# Patient Record
Sex: Female | Born: 1940 | Race: White | Hispanic: No | Marital: Married | Smoking: Never smoker
Health system: Southern US, Community
[De-identification: ages and names within clinical notes are randomized; demographics above are authoritative.]

## PROBLEM LIST (undated history)

## (undated) DIAGNOSIS — G309 Alzheimer's disease, unspecified: Principal | ICD-10-CM

## (undated) DIAGNOSIS — I480 Paroxysmal atrial fibrillation: Secondary | ICD-10-CM

## (undated) DIAGNOSIS — S0990XA Unspecified injury of head, initial encounter: Secondary | ICD-10-CM

## (undated) DIAGNOSIS — M1990 Unspecified osteoarthritis, unspecified site: Secondary | ICD-10-CM

## (undated) DIAGNOSIS — R413 Other amnesia: Secondary | ICD-10-CM

## (undated) DIAGNOSIS — F329 Major depressive disorder, single episode, unspecified: Secondary | ICD-10-CM

## (undated) DIAGNOSIS — R42 Dizziness and giddiness: Secondary | ICD-10-CM

## (undated) DIAGNOSIS — E785 Hyperlipidemia, unspecified: Secondary | ICD-10-CM

## (undated) DIAGNOSIS — E669 Obesity, unspecified: Secondary | ICD-10-CM

## (undated) DIAGNOSIS — F0280 Dementia in other diseases classified elsewhere without behavioral disturbance: Secondary | ICD-10-CM

## (undated) DIAGNOSIS — I10 Essential (primary) hypertension: Secondary | ICD-10-CM

## (undated) DIAGNOSIS — N390 Urinary tract infection, site not specified: Secondary | ICD-10-CM

## (undated) DIAGNOSIS — F32A Depression, unspecified: Secondary | ICD-10-CM

## (undated) HISTORY — DX: Other amnesia: R41.3

## (undated) HISTORY — PX: ABDOMINAL HYSTERECTOMY: SHX81

## (undated) HISTORY — PX: HUMERUS FRACTURE SURGERY: SHX670

## (undated) HISTORY — DX: Dizziness and giddiness: R42

## (undated) HISTORY — DX: Essential (primary) hypertension: I10

## (undated) HISTORY — DX: Dementia in other diseases classified elsewhere without behavioral disturbance: F02.80

## (undated) HISTORY — DX: Unspecified osteoarthritis, unspecified site: M19.90

## (undated) HISTORY — DX: Alzheimer's disease, unspecified: G30.9

## (undated) HISTORY — DX: Hyperlipidemia, unspecified: E78.5

## (undated) HISTORY — DX: Depression, unspecified: F32.A

## (undated) HISTORY — DX: Paroxysmal atrial fibrillation: I48.0

## (undated) HISTORY — PX: CHOLECYSTECTOMY: SHX55

## (undated) HISTORY — PX: REPLACEMENT TOTAL KNEE: SUR1224

## (undated) HISTORY — DX: Unspecified injury of head, initial encounter: S09.90XA

## (undated) HISTORY — DX: Obesity, unspecified: E66.9

## (undated) HISTORY — DX: Major depressive disorder, single episode, unspecified: F32.9

## (undated) HISTORY — PX: OTHER SURGICAL HISTORY: SHX169

---

## 2012-04-13 ENCOUNTER — Ambulatory Visit (INDEPENDENT_AMBULATORY_CARE_PROVIDER_SITE_OTHER): Payer: Medicare Other | Admitting: Internal Medicine

## 2012-04-13 VITALS — BP 110/80 | HR 51 | Ht 62.0 in | Wt 259.8 lb

## 2012-04-13 DIAGNOSIS — I495 Sick sinus syndrome: Secondary | ICD-10-CM

## 2012-04-13 DIAGNOSIS — R001 Bradycardia, unspecified: Secondary | ICD-10-CM

## 2012-04-13 DIAGNOSIS — I498 Other specified cardiac arrhythmias: Secondary | ICD-10-CM

## 2012-04-13 DIAGNOSIS — I4891 Unspecified atrial fibrillation: Secondary | ICD-10-CM

## 2012-04-13 DIAGNOSIS — I959 Hypotension, unspecified: Secondary | ICD-10-CM | POA: Insufficient documentation

## 2012-04-13 MED ORDER — TRIAMTERENE-HCTZ 37.5-25 MG PO TABS: 0.5000 | ORAL_TABLET | Freq: Every day | ORAL | Status: DC

## 2012-04-13 NOTE — Assessment & Plan Note (Signed)
Today her blood pressure is normal though she relates episodic low blood pressure. I've asked the patient to reduce her dose of triamterene/hydrochlorothiazide. If she develops peripheral edema she is instructed to call us.

## 2012-04-13 NOTE — Assessment & Plan Note (Signed)
She appears to be minimally symptomatic from her bradycardia. She may ultimately require backup pacing support.

## 2012-04-13 NOTE — Progress Notes (Signed)
HPI Sheila Charles is referred today by Dr. Juleen China for evaluation of PAF and sinus bradycardia. The patient has minimal palpitations. She is only had atrial fibrillation in the past. She was restarted on digoxin. She has also been bothered by bradycardia. The patient takes her heart rate and blood pressure several times a week. She notes that her blood pressure is usually normal oral at times low as is her heart rate. It appears that when she is out of rhythm she may go up to 100 beats per minute. She has not had syncope. The patient denies hypertension or diabetes. She does have chronic fatigue and weakness which is worse when she is in atrial fibrillation. No Known Allergies   Current Outpatient Prescriptions  Medication Sig Dispense Refill  . aspirin 81 MG tablet Take 81 mg by mouth daily.      . busPIRone (BUSPAR) 15 MG tablet Take 15 mg by mouth 2 (two) times daily.      . Cholecalciferol (VITAMIN D-3) 5000 UNITS TABS Take by mouth daily.      . digoxin (LANOXIN) 0.25 MG tablet Take 250 mcg by mouth daily.      Marland Kitchen donepezil (ARICEPT) 10 MG tablet Take 10 mg by mouth daily.      Marland Kitchen ezetimibe (ZETIA) 10 MG tablet Take 10 mg by mouth daily.      . sertraline (ZOLOFT) 50 MG tablet Take 50 mg by mouth daily.      . vitamin E 1000 UNIT capsule Take 800 Units by mouth daily.       Marland Kitchen DISCONTD: triamterene-hydrochlorothiazide (DYAZIDE) 37.5-25 MG per capsule Take 1 capsule by mouth every morning.      . triamterene-hydrochlorothiazide (MAXZIDE-25) 37.5-25 MG per tablet Take 0.5 each (0.5 tablets total) by mouth daily.  15 tablet  6     No past medical history on file.  ROS:   All systems reviewed and negative except as noted in the HPI.   No past surgical history on file.   No family history on file.   History   Social History  . Marital Status: Married    Spouse Name: N/A    Number of Children: N/A  . Years of Education: N/A   Occupational History  . Not on file.   Social  History Main Topics  . Smoking status: Never Smoker   . Smokeless tobacco: Not on file  . Alcohol Use: Not on file  . Drug Use: Not on file  . Sexually Active: Not on file   Other Topics Concern  . Not on file   Social History Narrative  . No narrative on file     BP 110/80  Pulse 51  Ht 5\' 2"  (1.575 m)  Wt 117.845 kg (259 lb 12.8 oz)  BMI 47.52 kg/m2  Physical Exam:  Well appearing middle-aged woman, NAD HEENT: Unremarkable Neck:  No JVD, no thyromegally Lungs:  Clear with no wheezes, rales, or rhonchi. HEART:  Regular rate rhythm, no murmurs, no rubs, no clicks Abd:  Obese, positive bowel sounds, no organomegally, no rebound, no guarding Ext:  2 plus pulses, no edema, no cyanosis, no clubbing Skin:  No rashes no nodules Neuro:  CN II through XII intact, motor grossly intact  EKG Sinus bradycardia 51 beats per minute  Assess/Plan:

## 2012-04-13 NOTE — Assessment & Plan Note (Signed)
The patient has paroxysmal atrial fibrillation and today we discussed treatment options. She is symptomatic. We discussed the pros and cons of digoxin. Unlike other AV nodal blocking drugs, digoxin is not lower her blood pressure. She relates a relative low blood pressure. For this reason I would be inclined to continue digoxin. She will need a digoxin level in the next few weeks. The patient's thromboembolic risk factor is low. For this reason I do not think she needs anything other than aspirin for thromboembolic prevention. She would be a candidate for antiarrhythmic drug therapy. Unfortunately, most of these medications exacerbate bradycardia. In the long term, I suspect she will end up on antiarrhythmic drug therapy and need a pacemaker. For now however I recommended a period of watchful waiting.

## 2012-04-13 NOTE — Patient Instructions (Signed)
Your physician has recommended you make the following change in your medication:  Decrease your Triamterene to 37.5/25 taking 1/2 tablet each day  Your physician recommends that you schedule a follow-up appointment in: 3 months

## 2012-05-28 ENCOUNTER — Telehealth: Payer: Self-pay | Admitting: Internal Medicine

## 2012-05-28 ENCOUNTER — Encounter: Payer: Self-pay | Admitting: Nurse Practitioner

## 2012-05-28 ENCOUNTER — Ambulatory Visit (INDEPENDENT_AMBULATORY_CARE_PROVIDER_SITE_OTHER): Payer: Medicare Other | Admitting: Nurse Practitioner

## 2012-05-28 VITALS — BP 102/70 | HR 76 | Ht 62.0 in | Wt 256.0 lb

## 2012-05-28 DIAGNOSIS — I4891 Unspecified atrial fibrillation: Secondary | ICD-10-CM

## 2012-05-28 DIAGNOSIS — R42 Dizziness and giddiness: Secondary | ICD-10-CM

## 2012-05-28 DIAGNOSIS — I480 Paroxysmal atrial fibrillation: Secondary | ICD-10-CM | POA: Insufficient documentation

## 2012-05-28 MED ORDER — MECLIZINE HCL 25 MG PO TABS: 25.0000 mg | ORAL_TABLET | Freq: Three times a day (TID) | ORAL | Status: AC | PRN

## 2012-05-28 MED ORDER — DIGOXIN 125 MCG PO TABS: 125.0000 ug | ORAL_TABLET | Freq: Every day | ORAL | Status: DC

## 2012-05-28 NOTE — Progress Notes (Signed)
Patient Name: Sheila Charles Date of Encounter: 05/28/2012  Primary Care Provider:  Marylen Ponto, MD, MD Primary Cardiologist:  Reece Agar. Ladona Ridgel, MD  Patient Profile  71 year old female with history of paroxysmal atrial fibrillation who presents following an episode of lightheadedness this morning.  Problem List   Past Medical History  Diagnosis Date  . Paroxysmal atrial fibrillation   . Vertigo   . Hyperlipidemia   . Hypertension   . Depression    No past surgical history on file.  Allergies  No Known Allergies  HPI  71 year old female with the above problem list.  She was in usual state of health until this morning when after awakening she felt lightheaded and dizzy.  Her husband checked her blood pressure is noted to be in the 80s and 90s systolic.  He gave her her medicines including triamterene HCTZ and call the office for advice.  By the time he called she was feeling somewhat better they were advised to present for evaluation today.  Currently she is asymptomatic.  She does report a history of vertigo that especially bad when she is lying down flat on her back.  She has not any chest pain or dyspnea.  She denies PND, orthopnea, syncope, or edema.  Of note, patient is on digoxin therapy for paroxysmal fibrillation.  She has not had any recent palpitations.  Her husband was noting heart rates dropping at times to the 30s and 40s and he reduced her digoxin dose to one tablet every other day.  Home Medications  Prior to Admission medications   Medication Sig Start Date End Date Taking? Authorizing Provider  aspirin 81 MG tablet Take 81 mg by mouth daily.   Yes Historical Provider, MD  busPIRone (BUSPAR) 15 MG tablet Take 15 mg by mouth 2 (two) times daily.   Yes Historical Provider, MD  Cholecalciferol (VITAMIN D-3) 5000 UNITS TABS Take by mouth daily.   Yes Historical Provider, MD  digoxin (LANOXIN) 0.125 MG tablet Take 1 tablet (125 mcg total) by mouth daily. 05/28/12  Yes  Ok Anis, NP  donepezil (ARICEPT) 10 MG tablet Take 10 mg by mouth daily.   Yes Historical Provider, MD  ezetimibe (ZETIA) 10 MG tablet Take 10 mg by mouth daily.   Yes Historical Provider, MD  sertraline (ZOLOFT) 50 MG tablet Take 50 mg by mouth daily.   Yes Historical Provider, MD  triamterene-hydrochlorothiazide (MAXZIDE-25) 37.5-25 MG per tablet Take 0.5 each (0.5 tablets total) by mouth daily. 04/13/12 04/13/13 Yes Marinus Maw, MD  vitamin E 1000 UNIT capsule Take 800 Units by mouth daily.    Yes Historical Provider, MD  meclizine (ANTIVERT) 25 MG tablet Take 1 tablet (25 mg total) by mouth 3 (three) times daily as needed. 05/28/12 06/07/12  Ok Anis, NP   Review of Systems Lightheaded spell as above with chronic vertiginous symptoms. All other systems reviewed and are otherwise negative except as noted above.  Physical Exam  Blood pressure 102/70, pulse 76, height 5\' 2"  (1.575 m), weight 256 lb (116.121 kg).  Orthostatics: lying 113/65, 54; sitting 115/72, 68; standing 127/77, 66->  122/80, 73->  102/70, 76. General: Pleasant, NAD Psych: Normal affect. Neuro: Alert and oriented X 3. Moves all extremities spontaneously. HEENT: Normal  Neck: Supple without bruits or JVD. Lungs:  Resp regular and unlabored, CTA. Heart: RRR no s3, s4, or murmurs. Abdomen: Soft, non-tender, non-distended, BS + x 4.  Extremities: No clubbing, cyanosis or edema. DP/PT/Radials 2+ and equal bilaterally.  Accessory Clinical Findings  ECG - sinus bradycardia, 53, no acute ST or T changes.  Assessment & Plan  1.  Dizziness/lightheadedness: Patient had a lightheaded episode this morning in the setting of recorded blood pressure in the 90s.  On exam today she was mildly orthostatic by heart rate.  Blood pressures were stable and she was not symptomatic while we were performing orthostatic vital signs.  She is on triamterene HCTZ at home based on her vital signs today appears that this is  a good job keeping her blood pressure and check.  She may need to get a little more slowly and we discussed this.  I would not make any changes to her home regimen with the exception advised her husband that if she is symptomatic with blood pressure less than 90 that he should hold her triamterene HCTZ.  She also has a history of vertigo which may be contributing to her overall symptoms and have given her prescription for p.r.n. Meclizine and recommended that she followup with primary care provider as this is been a chronic issue.  2.  Paroxysmal atrial fibrillation she is in sinus rhythm today.  Continue digoxin therapy.  We'll check a digoxin level.  Further, as her husband has already reduced her to 0.25 mg every other day we will simply switch this to 0.125 mg daily to make it easier on him and to ensure that she doesn't miss doses.  She is on aspirin only for anticoagulation.  3.  Disposition followup with Dr. Ladona Ridgel as scheduled.    Nicolasa Ducking, NP 05/28/2012, 5:03 PM

## 2012-05-28 NOTE — Patient Instructions (Signed)
Your physician recommends that you schedule a follow-up appointment as scheduled with Dr Ladona Ridgel Your physician recommends that you have lab work drawn today (Digoxin level) Your physician has recommended you make the following change in your medication: DECREASE Digoxin to 0.125mg  daily and TAKE Meclizine 25 mg three times a day as needed

## 2012-05-28 NOTE — Telephone Encounter (Signed)
SPOKE WITH PT'S HUSBAND   B/P  RUNNING  LOW THIS AM  ALSO C/O  WEAKNESS AND DIZZINESS  PT HAS TAKEN AM  MEDS  TRIAMTERENE/HCTZ  37.5 /25 MG  1/2 TAB AND  DIGOXIN  0.25 MG  EVERY OTHER DAY  PER HUSBAND PT  HAD FREQUENT URINATION LAST NIGHT  NEEDING TO GO EVERY 30 MIN WHICH IS UNUSUAL  NO BURNING NOTED  PER PT  INSTRUCTED  FOR PT TO INCREASE  FLUID INTAKE AND WILL FORWARD  MESSAGE TO DR Ladona Ridgel FOR REVIEW PT TO CALL BACK IF S/S WORSEN .Zack Seal

## 2012-05-28 NOTE — Telephone Encounter (Signed)
Patient husband Sheila Charles would like a return call at [phone]  Patient is feeling extremely weak, over the last day or so.  Patient readings this morning BP 96/60 PR 77, and 89/48 PR 69 also patient is c/o irregular heartbeat. Patient also has been urinating frequently all night.  WARM XFER TO CHRISTINE Y.

## 2012-05-28 NOTE — Telephone Encounter (Signed)
DISCUSSED MESSAGE WITH KELLY LANIER , CHRIS BERGE NP HAS OPENING THIS AM   PT TO COME IN AND BE SEEN TODAY FOR EVAL AND TREATMENT./CY

## 2012-07-13 ENCOUNTER — Ambulatory Visit: Payer: Medicare Other | Admitting: Internal Medicine

## 2012-08-23 ENCOUNTER — Ambulatory Visit: Payer: Medicare Other | Admitting: Internal Medicine

## 2012-09-06 NOTE — Addendum Note (Signed)
Addended by: Reine Just on: 09/06/2012 02:17 PM   Modules accepted: Orders

## 2012-10-08 ENCOUNTER — Ambulatory Visit: Payer: Medicare Other | Admitting: Internal Medicine

## 2013-01-04 ENCOUNTER — Ambulatory Visit: Payer: Medicare Other | Admitting: Internal Medicine

## 2013-07-18 ENCOUNTER — Telehealth: Payer: Self-pay | Admitting: Neurology

## 2013-07-19 MED ORDER — MEMANTINE HCL ER 28 MG PO CP24: 28.0000 mg | ORAL_CAPSULE | Freq: Every day | ORAL | Status: DC

## 2013-07-19 MED ORDER — DONEPEZIL HCL 10 MG PO TABS: 10.0000 mg | ORAL_TABLET | Freq: Every day | ORAL | Status: DC

## 2013-07-19 NOTE — Telephone Encounter (Signed)
Rx's sent.  Was not able to reach pharmacy via phone.

## 2013-08-10 ENCOUNTER — Telehealth: Payer: Self-pay | Admitting: Neurology

## 2013-08-12 MED ORDER — MEMANTINE HCL 10 MG PO TABS: 10.0000 mg | ORAL_TABLET | Freq: Two times a day (BID) | ORAL | Status: DC

## 2013-08-12 NOTE — Telephone Encounter (Signed)
Spouse says since patient has changed to Namenda XR her uti's and depression has increased significantly. Next OV w/ Dr. Anne Hahn is scheduled for 02/08/14.

## 2013-08-12 NOTE — Telephone Encounter (Signed)
I called patient. I talked with the husband. The patient has been on Namenda 10 mg twice daily for over 2 years, and she has done well with this. On a 28 mg extended-release capsule, she's had increased confusion, urinary tract infections. I am not sure this is related to the Namenda, but the other muscles back to the 10 mg tablets twice daily. I will call in a prescription.

## 2014-02-08 ENCOUNTER — Ambulatory Visit: Payer: Self-pay | Admitting: Neurology

## 2014-07-24 ENCOUNTER — Encounter: Payer: Self-pay | Admitting: Neurology

## 2014-07-24 ENCOUNTER — Ambulatory Visit (INDEPENDENT_AMBULATORY_CARE_PROVIDER_SITE_OTHER): Payer: Medicare HMO | Admitting: Neurology

## 2014-07-24 VITALS — BP 130/80 | HR 68 | Ht 62.5 in | Wt 221.0 lb

## 2014-07-24 DIAGNOSIS — F0280 Dementia in other diseases classified elsewhere without behavioral disturbance: Secondary | ICD-10-CM

## 2014-07-24 DIAGNOSIS — G309 Alzheimer's disease, unspecified: Principal | ICD-10-CM

## 2014-07-24 HISTORY — DX: Dementia in other diseases classified elsewhere, unspecified severity, without behavioral disturbance, psychotic disturbance, mood disturbance, and anxiety: F02.80

## 2014-07-24 MED ORDER — DONEPEZIL HCL 23 MG PO TABS: 23.0000 mg | ORAL_TABLET | Freq: Every day | ORAL | Status: DC

## 2014-07-24 NOTE — Patient Instructions (Signed)
Alzheimer Disease Alzheimer disease is a mental disorder. It causes memory loss and loss of other mental functions, such as learning, thinking, problem solving, communicating, and completing tasks. The mental losses interfere with the ability to perform daily activities at work, at home, or in social situations. Alzheimer disease usually starts in a person's late 60s or early 70s but can start earlier in life (familial form). The mental changes caused by this disease are permanent and worsen over time. As the illness progresses, the ability to do even the simplest things is lost. Survival with Alzheimer disease ranges from several years to as long as 20 years. CAUSES Alzheimer disease is caused by abnormally high levels of a protein (beta-amyloid) in the brain. This protein forms very small deposits within and around the brain's nerve cells. These deposits prevent the nerve cells from working properly. Experts are not certain what causes the beta-amyloid deposits in this disease. RISK FACTORS The following major risk factors have been identified:  Increasing age.  Certain genetic variations, such as Down syndrome (trisomy 21). SYMPTOMS In the early stages of Alzheimer disease, you are still able to perform daily activities but need greater effort, more time, or memory aids. Early symptoms include:  Mild memory loss of recent events, names, or phone numbers.  Loss of objects.  Minor loss of vocabulary.  Difficulty with complex tasks, such as paying bills or driving in unfamiliar locations. Other mental functions deteriorate as the disease worsens. These changes slowly go from mild to severe. Symptoms at this stage include:  Difficulty remembering. You may not be able to recall personal information such as your address and telephone number. You may become confused about the date, the season of the year, or your location.  Difficulty maintaining attention. You may forget what you wanted to say  during conversations and repeat what you have already said.  Difficulty learning new information or tasks. You may not remember what you read or the name of a new friend you met.  Difficulty counting or doing math. You may have difficulty with complex math problems. You may make mistakes in paying bills or managing your checkbook.  Poor reasoning and judgment. You may make poor decisions or not dress right for the weather.  Difficulty communicating. You may have regular difficulty remembering words, naming objects, expressing yourself clearly, or writing sentences that make sense.  Difficulty performing familiar daily activities. You may get lost driving in familiar locations or need help eating, bathing, dressing, grooming, or using the toilet. You may have difficulty maintaining bladder or bowel control.  Difficulty recognizing familiar faces. You may confuse family members or close friends with one another. You may not recognize a close relative or may mistake strangers for family. Alzheimer disease also may cause changes in personality and behavior. These changes include:   Loss of interest or motivation.  Social withdrawal.  Anxiety.  Difficulty sleeping.  Uncharacteristic anger or combativeness.  A false belief that someone is trying to harm you (paranoia).  Seeing things that are not real (hallucinations).  Agitation. Confusion and disruptive behavior are often worse at night and may be triggered by changes in the environment or acute medical issues. DIAGNOSIS  Alzheimer disease is diagnosed through an assessment by your health care provider. During this assessment, your health care provider will do the following:  Ask you and your family, friends, or caregivers questions about your symptoms, their frequency, their duration and progression, and the effect they are having on your life.    Ask questions about your personal and family medical history and use of alcohol or drugs,  including prescription medicine.  Perform a physical exam and order blood tests and brain imaging exams. Your health care provider may refer you to a specialist for detailed evaluation of your mental functions (neuropsychological testing).  Many different brain disorders, medical conditions, and certain substances can cause symptoms that resemble Alzheimer disease symptoms. These must be ruled out before this disease can be diagnosed. If Alzheimer disease is diagnosed, it will be considered either "possible" or "probable" Alzheimer disease. "Possible" Alzheimer disease means that your symptoms are typical of the disease and no other disorder is causing them. "Probable" Alzheimer disease means that you also have a family history of the disease or genetic test results that support the diagnosis. Certain tests, mostly used in research studies, are highly specific for Alzheimer disease.  TREATMENT  There is currently no cure for this disease. The goals of treatment are to:  Slow down the progression of the disease.  Preserve mental function as long as possible.  Manage behavioral symptoms.  Make life easier for the person with Alzheimer disease and his or her caregivers. The following treatment options are available:  Medicine. Certain medicines may help slow memory loss by changing the level of certain chemicals in the brain. Medicine may also help with behavioral symptoms.  Talk therapy. Talk therapy provides education, support, and memory aids for people with this disease. It is most effective in the early stages of the illness.  Caregiving. Caregivers may be family members, friends, or trained medical professionals. They help the person with Alzheimer disease with daily life activities. Caregiving may take place at home or at a nursing facility.  Family support groups. These provide education, emotional support, and information about community resources to family members who are taking care of  the person with this disease. Document Released: 08/26/2004 Document Revised: 05/01/2014 Document Reviewed: 04/22/2013 Exit[employer] Patient Information 2015 [employer]. This information is not intended to replace advice given to you by your health care provider. Make sure you discuss any questions you have with your health care provider.  

## 2014-07-24 NOTE — Progress Notes (Signed)
Reason for visit: Alzheimer's disease   Sheila BerryLinda Charles is an 73 y.o. female  History of present illness:  Sheila Charles is a 73 year old right-handed white female with a history of a progressive memory disturbance. The patient has not been seen through this office since 03/31/2012. The patient is on Aricept and was on Namenda. She stop Namenda 6 months ago, as it did not appear to help. The patient has gradually gotten worse with her memory, and she has developed some agitation. She is now on Abilify which seems to help. The patient sleeps well at night, and the husband will also give her some Benadryl to help her sleep. The patient is confused, oftentimes does not recognize family members including her husband. The patient may have some problems with hallucinations. She has a lot of anxiety, and has separation anxiety from her husband. The patient requires assistance with bathing and dressing. She does not want to bathe on a regular basis. Occasionally, she may wander, thinking that her parents live next door to her. Generally, the confusion is worse in the evenings. She returns to this office for an evaluation.  Past Medical History  Diagnosis Date  . Paroxysmal atrial fibrillation   . Vertigo   . Hyperlipidemia   . Hypertension   . Depression   . Memory loss   . Degenerative arthritis   . Closed head injury   . Alzheimer's disease 07/24/2014  . Obesity     Past Surgical History  Procedure Laterality Date  . Abdominal hysterectomy    . Replacement total knee Right   . Gallbladder resection    . Humerus fracture surgery Right     Family History  Problem Relation Age of Onset  . Stroke Mother   . Cancer Father   . Dementia Sister     Social history:  reports that she has never smoked. She has never used smokeless tobacco. She reports that she does not drink alcohol or use illicit drugs.   No Known Allergies  Medications:  Current Outpatient Prescriptions on File Prior to  Visit  Medication Sig Dispense Refill  . aspirin 81 MG tablet Take 81 mg by mouth daily.      . busPIRone (BUSPAR) 15 MG tablet Take 15 mg by mouth 2 (two) times daily.      . digoxin (LANOXIN) 0.125 MG tablet Take 1 tablet (125 mcg total) by mouth daily.  30 tablet  6  . vitamin E 1000 UNIT capsule Take 800 Units by mouth daily.       . Cholecalciferol (VITAMIN D-3) 5000 UNITS TABS Take by mouth daily.      . sertraline (ZOLOFT) 50 MG tablet Take 50 mg by mouth daily.       No current facility-administered medications on file prior to visit.    ROS:  Out of a complete 14 system review of symptoms, the patient complains only of the following symptoms, and all other reviewed systems are negative.  Appetite change Eye itching Cough, wheezing Snoring Memory loss Behavior problem, confusion, depression, anxiety  Blood pressure 130/80, pulse 68, height 5' 2.5" (1.588 m), weight 221 lb (100.245 kg).  Physical Exam  General: The patient is alert and cooperative at the time of the examination. The patient is moderately to markedly obese.  Skin: 2+ edema of ankles is noted.   Neurologic Exam  Mental status: The Mini-Mental status examination done today shows a total score of 9/30. The animal fluency test reveals that the  patient is able to name 3 animals in 30 seconds.  Cranial nerves: Facial symmetry is present. Speech is normal, no aphasia or dysarthria is noted. Extraocular movements are full. Visual fields are full.  Motor: The patient has good strength in all 4 extremities.  Sensory examination: Soft touch sensation is symmetric on the face, arms, or legs.  Coordination: The patient has good finger-nose-finger and heel-to-shin bilaterally. Apraxia with the use of the lower extremities is noted.  Gait and station: The patient has a normal gait. Tandem gait is normal. Romberg is negative. No drift is seen.  Reflexes: Deep tendon reflexes are symmetric.   MR brain  05/05/12:  Impression: Abnormal MRI brain (without contrast) demonstrating: 1. Moderate parietal and temporal atrophy.  2. Mild periventricular and subcortical chronic small vessel ischemic disease.   Assessment/Plan:  One. Alzheimer's disease  The patient will be increased on the Aricept taking the 23 mg extended release tablet. The husband does not have much support in helping out with taking care of his wife. The patient does have a son who lives in this area, but he does not offer a lot of assistance. The patient will followup in about 6 months.  Marlan Palau MD 07/24/2014 7:50 PM  [facility] [address] [place], [place] [address]  Phone [phone] Fax [phone]

## 2014-08-23 ENCOUNTER — Encounter: Payer: Self-pay | Admitting: Family Medicine

## 2014-08-23 ENCOUNTER — Ambulatory Visit (INDEPENDENT_AMBULATORY_CARE_PROVIDER_SITE_OTHER): Payer: Medicare HMO | Admitting: Family Medicine

## 2014-08-23 VITALS — BP 138/78 | HR 75 | Temp 97.9°F | Resp 17 | Ht 61.0 in | Wt 216.0 lb

## 2014-08-23 DIAGNOSIS — I4891 Unspecified atrial fibrillation: Secondary | ICD-10-CM

## 2014-08-23 DIAGNOSIS — G309 Alzheimer's disease, unspecified: Principal | ICD-10-CM

## 2014-08-23 DIAGNOSIS — I10 Essential (primary) hypertension: Secondary | ICD-10-CM

## 2014-08-23 DIAGNOSIS — I480 Paroxysmal atrial fibrillation: Secondary | ICD-10-CM

## 2014-08-23 DIAGNOSIS — E785 Hyperlipidemia, unspecified: Secondary | ICD-10-CM

## 2014-08-23 DIAGNOSIS — F0280 Dementia in other diseases classified elsewhere without behavioral disturbance: Secondary | ICD-10-CM

## 2014-08-23 NOTE — Progress Notes (Signed)
Pre visit review using our clinic review tool, if applicable. No additional management support is needed unless otherwise documented below in the visit note. 

## 2014-08-23 NOTE — Patient Instructions (Signed)
Schedule a follow up for when you have your appt- we will repeat cholesterol and check BP We will consult Hospice and get all available resources to help you If her condition is changing or worsening, please call Dr Anne Hahn to discuss next steps Call with any questions or concerns Hang in there!

## 2014-08-23 NOTE — Progress Notes (Signed)
   Subjective:    Patient ID: Sheila Charles, female    DOB: June 18, 1941, 73 y.o.   MRN: [id number]  HPI New to establish.  Previous MD- Sheila Po, NP.  Alzheimers- dx'd 5 yrs ago and started on Aricept but husband believes she had dx long before this.  Seeing Dr Sheila Charles.  On Lexapro, Buspar- husband is holding this, Ativan, Abilify.  Pt is having difficulty w/ anxiety.  Pt is unable to be left alone due to her anxiety.  Pt reports that pt is unable to walk w/ any surety- 'she can't find the ground, she can't see the steps'.  Pt is having difficult time getting pt to bathe.  Pt is now incontinent of bowel and bladder.  No family help- daughter is 3 hrs away, son 'might come by once every 2 months'.  HTN- chronic problem, on Triamterene HCTZ daily.  Denies CP, SOB.  Afib- chronic problem, pt is on Digoxin but this is to be held for HR<60.  Not on blood thinners- was previously.  Not seeing cards.   Hyperlipidemia- chronic problem, on Simvastatin.  Had labs done on June 9th but these results not available today for review.    Review of Systems For ROS see HPI     Objective:   Physical Exam  Vitals reviewed. Constitutional:  Pt frail, elderly woman who is minimally verbal and sits w/ mouth gaping during OV  HENT:  Head: Normocephalic and atraumatic.  Neck: No thyromegaly present.  Cardiovascular: Intact distal pulses.   Irregularly irregular S1/S2  Pulmonary/Chest: Effort normal and breath sounds normal. No respiratory distress. She has no wheezes. She has no rales.  Abdominal: Soft. Bowel sounds are normal. She exhibits no distension. There is no tenderness. There is no rebound.  Musculoskeletal: She exhibits no edema.  Neurological: Coordination (stooped, shuffling gait) abnormal.  Pt w/ decreased alertness to her surroundings, not oriented to person, place, time  Skin: Skin is warm and dry.  Psychiatric:  Anxious and agitated if husband is out of sight          Assessment &  Plan:

## 2014-08-24 ENCOUNTER — Telehealth: Payer: Self-pay | Admitting: Family Medicine

## 2014-08-24 NOTE — Telephone Encounter (Signed)
Pt husband notified that these were attached to his other papers that were returned yesterday. Pt husband states that he will look for them.

## 2014-08-24 NOTE — Telephone Encounter (Signed)
Caller name: Marcelene  Relation to pt: self  Call back number: [phone] Pharmacy:  Reason for call:   pt was seen 08/23/14 pt stated the power of attorney financial's were not given back after copies were made please mail to pt address.

## 2014-08-30 DIAGNOSIS — E785 Hyperlipidemia, unspecified: Secondary | ICD-10-CM | POA: Insufficient documentation

## 2014-08-30 DIAGNOSIS — I10 Essential (primary) hypertension: Secondary | ICD-10-CM | POA: Insufficient documentation

## 2014-08-30 NOTE — Assessment & Plan Note (Signed)
New to provider, ongoing for pt.  Adequate control.  Difficult to determine whether pt is symptomatic due to her limited interactions and minimal vocalization.  No med changes at this time.

## 2014-08-30 NOTE — Assessment & Plan Note (Signed)
New to provider, ongoing for pt.  Situation is severe w/ recent MMSE of 9.  Unfortunately husband is not well informed about pt's dx and disease progression.  He states that recently she is 'very tired' and 'can't see right' b/c 'she can't walk right or go upstairs'.  Pt has limited family assistance and husband is limited in the care he can provide her.  Discussed Hospice referral to provide care assistance- husband in agreement w/ this.  Pt following w/ neuro- will defer any medication changes to them.  Will follow along.

## 2014-08-30 NOTE — Assessment & Plan Note (Signed)
New to provider, ongoing for pt.  She had recent labs done w/ previous PCP- not available for review today.  Husband signed record release.  No med changes at this time.

## 2014-08-30 NOTE — Assessment & Plan Note (Signed)
New to provider, ongoing for pt.  She is in Afib today but is rate controlled.  Husband is holding Dig dose for HR<60.  Pt is not candidate for anticoagulation due to high fall risk.  Will follow.

## 2014-08-31 ENCOUNTER — Telehealth: Payer: Self-pay | Admitting: Family Medicine

## 2014-08-31 DIAGNOSIS — F0280 Dementia in other diseases classified elsewhere without behavioral disturbance: Secondary | ICD-10-CM

## 2014-08-31 DIAGNOSIS — G309 Alzheimer's disease, unspecified: Principal | ICD-10-CM

## 2014-08-31 NOTE — Telephone Encounter (Signed)
Dr Delanna Notice indicates that pt is not yet a candidate for Hospice b/c he feels she has more than 6 months to live.  Agrees that pt and husband need more help at home and recommended [facility] referral.

## 2014-08-31 NOTE — Telephone Encounter (Signed)
Referral placed to [facility] 

## 2014-08-31 NOTE — Telephone Encounter (Signed)
Caller name: Dr. Kern Reap, MD Relation to pt: other  Call back number: [phone]   Reason for call:   Dr. Kern Reap, MD Specializes in [facility] & Palliative Medicine  would like Dr. Beverely Low to call him on he's cell phone [phone]

## 2014-09-01 ENCOUNTER — Telehealth: Payer: Self-pay | Admitting: General Practice

## 2014-09-01 NOTE — Telephone Encounter (Signed)
[name] from [facility] called called back and advised that they can accept Sheila Charles. Last OV, Demographics and insurance information sent to [facility] today.

## 2014-09-01 NOTE — Telephone Encounter (Signed)
Received a call from [facility]. Per Bonita Quin they do not accept [employer] so they cannot help this pt.   Called [name] with [facility] to see if they accept [employer].

## 2014-09-01 NOTE — Telephone Encounter (Signed)
Appreciate the attempt to reach out to [facility].

## 2014-09-08 ENCOUNTER — Telehealth: Payer: Self-pay | Admitting: General Practice

## 2014-09-08 NOTE — Telephone Encounter (Signed)
Noted! Thank you

## 2014-09-08 NOTE — Telephone Encounter (Signed)
Spoke with pt husband who advised that they initially told health that 4pm was to late to have pt get bathed. (the fax we received). Husband stated that that was the only thing that was refused. States pt was not sure of the new nurse. Therapist was out yesterday and another will be there at 3:30pm today.

## 2014-09-13 ENCOUNTER — Emergency Department (HOSPITAL_BASED_OUTPATIENT_CLINIC_OR_DEPARTMENT_OTHER): Payer: Medicare HMO

## 2014-09-13 ENCOUNTER — Observation Stay (HOSPITAL_BASED_OUTPATIENT_CLINIC_OR_DEPARTMENT_OTHER)
Admission: EM | Admit: 2014-09-13 | Discharge: 2014-09-14 | Disposition: A | Discharge status: Home / Self Care | Payer: Medicare HMO | Attending: Internal Medicine | Admitting: Internal Medicine

## 2014-09-13 ENCOUNTER — Encounter: Payer: Self-pay | Admitting: Medical

## 2014-09-13 ENCOUNTER — Encounter (HOSPITAL_BASED_OUTPATIENT_CLINIC_OR_DEPARTMENT_OTHER): Payer: Self-pay | Admitting: Emergency Medicine

## 2014-09-13 ENCOUNTER — Ambulatory Visit (INDEPENDENT_AMBULATORY_CARE_PROVIDER_SITE_OTHER): Payer: Medicare HMO | Admitting: Medical

## 2014-09-13 VITALS — BP 153/80 | HR 70 | Temp 97.9°F | Ht 65.2 in | Wt 210.8 lb

## 2014-09-13 DIAGNOSIS — R9431 Abnormal electrocardiogram [ECG] [EKG]: Principal | ICD-10-CM

## 2014-09-13 DIAGNOSIS — R001 Bradycardia, unspecified: Secondary | ICD-10-CM

## 2014-09-13 DIAGNOSIS — I480 Paroxysmal atrial fibrillation: Secondary | ICD-10-CM

## 2014-09-13 DIAGNOSIS — M79609 Pain in unspecified limb: Secondary | ICD-10-CM | POA: Diagnosis not present

## 2014-09-13 DIAGNOSIS — B49 Unspecified mycosis: Secondary | ICD-10-CM

## 2014-09-13 DIAGNOSIS — M79602 Pain in left arm: Secondary | ICD-10-CM

## 2014-09-13 DIAGNOSIS — R42 Dizziness and giddiness: Secondary | ICD-10-CM

## 2014-09-13 DIAGNOSIS — I4891 Unspecified atrial fibrillation: Secondary | ICD-10-CM | POA: Insufficient documentation

## 2014-09-13 DIAGNOSIS — G309 Alzheimer's disease, unspecified: Secondary | ICD-10-CM

## 2014-09-13 DIAGNOSIS — I10 Essential (primary) hypertension: Secondary | ICD-10-CM

## 2014-09-13 DIAGNOSIS — M79622 Pain in left upper arm: Secondary | ICD-10-CM | POA: Insufficient documentation

## 2014-09-13 DIAGNOSIS — F0280 Dementia in other diseases classified elsewhere without behavioral disturbance: Secondary | ICD-10-CM

## 2014-09-13 DIAGNOSIS — M7989 Other specified soft tissue disorders: Secondary | ICD-10-CM

## 2014-09-13 DIAGNOSIS — E785 Hyperlipidemia, unspecified: Secondary | ICD-10-CM

## 2014-09-13 HISTORY — DX: Urinary tract infection, site not specified: N39.0

## 2014-09-13 LAB — TROPONIN I: Troponin I: <0.3 ng/mL (ref <0.30)

## 2014-09-13 LAB — CBC WITH DIFFERENTIAL/PLATELET
BASOS PCT: 0 % (ref 0–1)
Basophils Absolute: 0 10*3/uL (ref 0.0–0.1)
EOS ABS: 0.2 10*3/uL (ref 0.0–0.7)
Eosinophils Relative: 3 % (ref 0–5)
HCT: 46 % (ref 36.0–46.0)
Hemoglobin: 15 g/dL (ref 12.0–15.0)
Lymphocytes Relative: 30 % (ref 12–46)
Lymphs Abs: 2.3 10*3/uL (ref 0.7–4.0)
MCH: 30.5 pg (ref 26.0–34.0)
MCHC: 32.6 g/dL (ref 30.0–36.0)
MCV: 93.7 fL (ref 78.0–100.0)
MONOS PCT: 13 % — AB (ref 3–12)
Monocytes Absolute: 1 10*3/uL (ref 0.1–1.0)
NEUTROS ABS: 4.1 10*3/uL (ref 1.7–7.7)
Neutrophils Relative %: 54 % (ref 43–77)
PLATELETS: 143 10*3/uL — AB (ref 150–400)
RBC: 4.91 MIL/uL (ref 3.87–5.11)
RDW: 13 % (ref 11.5–15.5)
WBC: 7.6 10*3/uL (ref 4.0–10.5)

## 2014-09-13 LAB — COMPREHENSIVE METABOLIC PANEL
ALK PHOS: 68 U/L (ref 39–117)
ALT: 9 U/L (ref 0–35)
AST: 17 U/L (ref 0–37)
Albumin: 3.3 g/dL — ABNORMAL LOW (ref 3.5–5.2)
Anion gap: 13 (ref 5–15)
BUN: 8 mg/dL (ref 6–23)
CO2: 28 mEq/L (ref 19–32)
Calcium: 9.5 mg/dL (ref 8.4–10.5)
Chloride: 100 mEq/L (ref 96–112)
Creatinine, Ser: 0.7 mg/dL (ref 0.50–1.10)
GFR calc Af Amer: >90 mL/min (ref >90)
GFR calc non Af Amer: 85 mL/min — ABNORMAL LOW (ref >90)
Glucose, Bld: 102 mg/dL — ABNORMAL HIGH (ref 70–99)
Potassium: 3.8 mEq/L (ref 3.7–5.3)
Sodium: 141 mEq/L (ref 137–147)
TOTAL PROTEIN: 7.2 g/dL (ref 6.0–8.3)
Total Bilirubin: 0.7 mg/dL (ref 0.3–1.2)

## 2014-09-13 LAB — DIGOXIN LEVEL: Digoxin Level: <0.3 ng/mL — ABNORMAL LOW (ref 0.8–2.0)

## 2014-09-13 MED ORDER — LORAZEPAM 0.5 MG PO TABS
0.5000 mg | ORAL_TABLET | Freq: Every day | ORAL | Status: DC
  Administered 2014-09-13: 0.5 mg via ORAL
  Filled 2014-09-13: qty 1

## 2014-09-13 MED ORDER — DIGOXIN 0.0625 MG HALF TABLET
62.5000 ug | ORAL_TABLET | Freq: Every day | ORAL | Status: DC
  Administered 2014-09-14: 62.5 ug via ORAL
  Filled 2014-09-13: qty 1

## 2014-09-13 MED ORDER — ESCITALOPRAM OXALATE 20 MG PO TABS
20.0000 mg | ORAL_TABLET | Freq: Every day | ORAL | Status: DC
  Administered 2014-09-14: 20 mg via ORAL
  Filled 2014-09-13: qty 1

## 2014-09-13 MED ORDER — EZETIMIBE 10 MG PO TABS
10.0000 mg | ORAL_TABLET | Freq: Every day | ORAL | Status: DC
Start: 2014-09-14 — End: 2014-09-14
  Administered 2014-09-14: 10 mg via ORAL
  Filled 2014-09-13: qty 1

## 2014-09-13 MED ORDER — DONEPEZIL HCL 23 MG PO TABS
23.0000 mg | ORAL_TABLET | Freq: Every day | ORAL | Status: DC
  Administered 2014-09-13: 23 mg via ORAL
  Filled 2014-09-13 (×2): qty 1

## 2014-09-13 MED ORDER — NYSTATIN 100000 UNIT/GM EX CREA: 1.0000 "application " | TOPICAL_CREAM | Freq: Two times a day (BID) | CUTANEOUS | Status: DC

## 2014-09-13 MED ORDER — VITAMIN E 180 MG (400 UNIT) PO CAPS
800.0000 [IU] | ORAL_CAPSULE | Freq: Every day | ORAL | Status: DC
  Administered 2014-09-14: 800 [IU] via ORAL
  Filled 2014-09-13: qty 2

## 2014-09-13 MED ORDER — ACETAMINOPHEN 325 MG PO TABS
650.0000 mg | ORAL_TABLET | ORAL | Status: DC | PRN
  Administered 2014-09-14: 650 mg via ORAL
  Filled 2014-09-13: qty 2

## 2014-09-13 MED ORDER — HEPARIN SODIUM (PORCINE) 5000 UNIT/ML IJ SOLN
5000.0000 [IU] | Freq: Three times a day (TID) | INTRAMUSCULAR | Status: DC
  Administered 2014-09-13 – 2014-09-14 (×2): 5000 [IU] via SUBCUTANEOUS
  Filled 2014-09-13 (×5): qty 1

## 2014-09-13 MED ORDER — ASPIRIN 81 MG PO TABS: 81.0000 mg | ORAL_TABLET | Freq: Every day | ORAL | Status: DC

## 2014-09-13 MED ORDER — BUSPIRONE HCL 5 MG PO TABS
5.0000 mg | ORAL_TABLET | Freq: Three times a day (TID) | ORAL | Status: DC
  Administered 2014-09-13: 5 mg via ORAL
  Filled 2014-09-13 (×4): qty 1

## 2014-09-13 MED ORDER — NYSTATIN 100000 UNIT/GM EX CREA
1.0000 "application " | TOPICAL_CREAM | Freq: Two times a day (BID) | CUTANEOUS | Status: DC
  Administered 2014-09-14: 1 via TOPICAL
  Filled 2014-09-13: qty 15

## 2014-09-13 MED ORDER — PANTOPRAZOLE SODIUM 40 MG PO TBEC
40.0000 mg | DELAYED_RELEASE_TABLET | Freq: Every day | ORAL | Status: DC
  Administered 2014-09-14: 40 mg via ORAL
  Filled 2014-09-13: qty 1

## 2014-09-13 MED ORDER — ARIPIPRAZOLE 10 MG PO TABS
10.0000 mg | ORAL_TABLET | Freq: Every day | ORAL | Status: DC
Start: 2014-09-14 — End: 2014-09-14
  Administered 2014-09-14: 10 mg via ORAL
  Filled 2014-09-13: qty 1

## 2014-09-13 MED ORDER — ONDANSETRON HCL 4 MG/2ML IJ SOLN: 4.0000 mg | Freq: Four times a day (QID) | INTRAMUSCULAR | Status: DC | PRN

## 2014-09-13 MED ORDER — TRIAMTERENE-HCTZ 37.5-25 MG PO TABS
0.5000 | ORAL_TABLET | Freq: Every day | ORAL | Status: DC
  Administered 2014-09-14: 0.5 via ORAL
  Filled 2014-09-13: qty 0.5

## 2014-09-13 MED ORDER — ASPIRIN EC 81 MG PO TBEC
81.0000 mg | DELAYED_RELEASE_TABLET | Freq: Every day | ORAL | Status: DC
  Administered 2014-09-14: 81 mg via ORAL
  Filled 2014-09-13: qty 1

## 2014-09-13 MED ORDER — SIMVASTATIN 40 MG PO TABS
40.0000 mg | ORAL_TABLET | Freq: Every day | ORAL | Status: DC
  Administered 2014-09-13: 40 mg via ORAL
  Filled 2014-09-13 (×2): qty 1

## 2014-09-13 MED ORDER — ASPIRIN 81 MG PO CHEW
324.0000 mg | CHEWABLE_TABLET | Freq: Once | ORAL | Status: AC
  Administered 2014-09-13: 324 mg via ORAL
  Filled 2014-09-13: qty 4

## 2014-09-13 NOTE — ED Provider Notes (Signed)
CSN: [id number]     Arrival date & time 09/13/14  1520 History   First MD Initiated Contact with Patient 09/13/14 1530     Chief Complaint  Patient presents with  . Arm Pain     (Consider location/radiation/quality/duration/timing/severity/associated sxs/prior Treatment) HPI Comments: Patient presents from PCPs office for further evaluation of arm pain that woke her from sleep last night. Husband reports patient woke him around 2 AM complaining of pain in her left hand and left wrist. He gave her Tylenol and this seemed to improve it. The pain recurred later in the morning. Patient denies this and does not recall complaining of this pain. Denies any fall or trauma. She has been working with therapy at home. When asked where the pain was, patient indicates her left upper arm. She denies it is hurting now. She does not know how long it lasted. Denies any shortness of breath, nausea, vomiting, fever or cough. Denies any leg pain or leg swelling. Husband thinks the arm is more swollen than usual.  Level V caveat for dementia  The history is provided by the patient and a relative. The history is limited by the condition of the patient.    Past Medical History  Diagnosis Date  . Paroxysmal atrial fibrillation   . Vertigo   . Hyperlipidemia   . Hypertension   . Depression   . Memory loss   . Degenerative arthritis   . Closed head injury   . Alzheimer's disease 07/24/2014  . Obesity    Past Surgical History  Procedure Laterality Date  . Abdominal hysterectomy    . Replacement total knee Right   . Gallbladder resection    . Humerus fracture surgery Right    Family History  Problem Relation Age of Onset  . Stroke Mother   . Cancer Father   . Dementia Sister    History  Substance Use Topics  . Smoking status: Never Smoker   . Smokeless tobacco: Never Used  . Alcohol Use: No   OB History   Grav Para Term Preterm Abortions TAB SAB Ect Mult Living                 Review of  Systems  Constitutional: Negative for fever, activity change and appetite change.  Respiratory: Negative for cough, chest tightness and shortness of breath.   Cardiovascular: Negative for chest pain.  Gastrointestinal: Negative for nausea, vomiting and abdominal pain.  Genitourinary: Negative for dysuria and hematuria.  Musculoskeletal: Positive for arthralgias and myalgias. Negative for back pain.  Skin: Negative for rash.  Neurological: Negative for dizziness, weakness and headaches.  A complete 10 system review of systems was obtained and all systems are negative except as noted in the HPI and PMH.      Allergies  Review of patient's allergies indicates no known allergies.  Home Medications   Prior to Admission medications   Medication Sig Start Date End Date Taking? Authorizing Provider  ARIPiprazole (ABILIFY) 10 MG tablet Take 10 mg by mouth daily. 06/06/14   Historical Provider, MD  aspirin 81 MG tablet Take 81 mg by mouth daily.    Historical Provider, MD  busPIRone (BUSPAR) 5 MG tablet Take 5 mg by mouth 3 (three) times daily.    Historical Provider, MD  digoxin (LANOXIN) 0.125 MG tablet Take 1 tablet (125 mcg total) by mouth daily. 05/28/12   Ok Anis, NP  donepezil (ARICEPT) 23 MG TABS tablet Take 1 tablet (23 mg total) by mouth  at bedtime. 07/24/14   York Spaniel, MD  escitalopram (LEXAPRO) 20 MG tablet Take 20 mg by mouth daily. 07/24/14   Historical Provider, MD  LORazepam (ATIVAN) 0.5 MG tablet Take 0.5 mg by mouth at bedtime. Takes 1-2 at bedtime 07/04/14   Historical Provider, MD  nystatin cream (MYCOSTATIN) Apply 1 application topically 2 (two) times daily. 09/13/14   Bayard Beaver Saguier, PA-C  pantoprazole (PROTONIX) 40 MG tablet Take 40 mg by mouth daily. 05/18/14   Historical Provider, MD  simvastatin (ZOCOR) 40 MG tablet Take 40 mg by mouth daily. 05/18/14   Historical Provider, MD  triamterene-hydrochlorothiazide (MAXZIDE-25) 37.5-25 MG per tablet Take 0.5  tablets by mouth daily.    Historical Provider, MD  vitamin E 1000 UNIT capsule Take 800 Units by mouth daily.     Historical Provider, MD  ZETIA 10 MG tablet Take 10 mg by mouth daily.  07/10/14   Historical Provider, MD   BP 130/59  Pulse 57  Temp(Src) 97.9 F (36.6 C) (Oral)  Resp 18  SpO2 96% Physical Exam  Nursing note and vitals reviewed. Constitutional: She is oriented to person, place, and time. She appears well-developed and well-nourished. No distress.  HENT:  Head: Normocephalic and atraumatic.  Mouth/Throat: Oropharynx is clear and moist. No oropharyngeal exudate.  Eyes: Conjunctivae and EOM are normal. Pupils are equal, round, and reactive to light.  Neck: Normal range of motion. Neck supple.  No meningismus.  Cardiovascular: Normal rate, regular rhythm, normal heart sounds and intact distal pulses.   No murmur heard. Pulmonary/Chest: Effort normal and breath sounds normal. No respiratory distress. She exhibits no tenderness.  Abdominal: Soft. There is no tenderness. There is no rebound and no guarding.  Musculoskeletal: Normal range of motion. She exhibits no edema and no tenderness.  There is no deformity to the left arm. There is no erythema. Intact radial pulse. Cardinal hand movements intact. FROM L shoulder, elbow, wrist. Equal grip strength bilaterally. Edematous fingers surrounding rings  Neurological: She is alert and oriented to person, place, and time. No cranial nerve deficit. She exhibits normal muscle tone. Coordination normal.  No ataxia on finger to nose bilaterally. No pronator drift. 5/5 strength throughout. CN 2-12 intact. Negative Romberg. Equal grip strength. Sensation intact. Gait is normal.   Skin: Skin is warm.  Psychiatric: She has a normal mood and affect. Her behavior is normal.    ED Course  Procedures (including critical care time) Labs Review Labs Reviewed  CBC WITH DIFFERENTIAL - Abnormal; Notable for the following:    Platelets 143 (*)     Monocytes Relative 13 (*)    All other components within normal limits  COMPREHENSIVE METABOLIC PANEL - Abnormal; Notable for the following:    Glucose, Bld 102 (*)    Albumin 3.3 (*)    GFR calc non Af Amer 85 (*)    All other components within normal limits  DIGOXIN LEVEL - Abnormal; Notable for the following:    Digoxin Level <0.3 (*)    All other components within normal limits  TROPONIN I  TROPONIN I  TROPONIN I  TROPONIN I    Imaging Review Dg Chest 2 View  09/13/2014   CLINICAL DATA:  Left upper extremity swelling  EXAM: CHEST  2 VIEW  COMPARISON:  None.  FINDINGS: Postsurgical changes are noted in the right humerus. The cardiac shadow is mildly enlarged. The lungs are clear bilaterally. No acute bony abnormality is seen.  IMPRESSION: No acute abnormality noted.  Electronically Signed   By: [name]   On: 09/13/2014 16:56   Dg Wrist Complete Left  09/13/2014   CLINICAL DATA:  Atraumatic left wrist pain and swelling for 2 days  EXAM: LEFT WRIST - COMPLETE 3+ VIEW  COMPARISON:  Left hand series of today's date  FINDINGS: The bones of the left wrist are mildly osteopenic. There is no acute fracture nor dislocation. There is moderate degenerative change of the first carpometacarpal joint. The radiocarpal, ulnocarpal, and intracarpal joints are unremarkable. There is mild diffuse soft tissue swelling. There is no soft tissue gas.  IMPRESSION: There is no acute bony abnormality of the left wrist.   Electronically Signed   By: David  [place]   On: 09/13/2014 16:55   Dg Hand Complete Left  09/13/2014   CLINICAL DATA:  Left hand pain and swelling  EXAM: LEFT HAND - COMPLETE 3+ VIEW  COMPARISON:  09/13/2014  FINDINGS: Diffuse osteopenia noted. Diffuse joint space loss of the DIP and PIP joints. Sclerosis and joint space loss of the left first CMC joint. These findings are compatible with osteoarthritis. No acute fracture demonstrated. Normal alignment.  IMPRESSION: Evidence of  osteoarthritis and osteopenia. No acute osseous finding.   Electronically Signed   By: [name]   On: 09/13/2014 16:53     EKG Interpretation   Date/Time:  Wednesday September 13 2014 15:30:53 EDT Ventricular Rate:  63 PR Interval:  190 QRS Duration: 94 QT Interval:  402 QTC Calculation: 411 R Axis:   14 Text Interpretation:  Sinus rhythm with occasional Premature ventricular  complexes Nonspecific ST abnormality Abnormal ECG No previous ECGs  available Confirmed by Arley Salamone  MD, Raylon Lamson [phone]) on 09/13/2014 3:32:18  PM      MDM   Final diagnoses:  EKG abnormality  Pain of left upper extremity   episode of arm pain last night, now resolved. No history of similar symptoms. History limited due to patient's dementia and conflicting story. EKG shows nonspecific ST depressions inferolaterally. No comparison.  Troponin negative.  Digoxin level undetectable. Rings removed.  Patient's dementia and unclear history, there is concern that arm pain could represent cardiac pain. EKG has ST depressions laterally. Patient and husband agreeable to observation admission. D/w Dr. Jerral Ralph.  Glynn Octave, MD 09/13/14 [phone]

## 2014-09-13 NOTE — Progress Notes (Signed)
73 year old female with history of dementia lives with husband, hypertension, dyslipidemia, paroxysmal atrial fibrillation on aspirin referred from PCPs office to med center [facility] for evaluation of left upper on pain. EKG shows nonspecific ST changes, initial troponin negative, patient being referred to the hospitalist for rule out ACS. Patient accepted for telemetry, observation.

## 2014-09-13 NOTE — ED Notes (Signed)
Pt brought from upstairs PCP for c/o left arm pain and that pt was unable to get on exam table in the office-pt to tx room via w/c-pt stood and pivoted to stretcher with 2-3 person assist-pt with hx dementia-per husband, pt c/o pain to left hand and arm last night-was relieved by tylenol-pain return today-was advised via phone to give tylenol and ice-he did- "seemed to help"-he denies injury-states pt has HHN and therapy to left shoulder-pt is alert-when asked if any pain pt states "no"-NAD noted

## 2014-09-13 NOTE — ED Notes (Signed)
MD at bedside. 

## 2014-09-13 NOTE — H&P (Signed)
[facility] History and Physical  Sheila Charles ZOX:[id number] DOB: Jun 07, 1941 DOA: 09/13/2014  Referring physician: EDP PCP: [name], MD   Chief Complaint: Arm pain   HPI: Sheila Charles is a 73 y.o. female with end stage dementia who is brought in by family for L arm pain today.  Pain is associated with edema of the entire left arm.  Her rings had to be removed in the [facility] ED.  The edema of the arm is new.  Unfortunately due to dementia patient isnt able to contribute much to history at this point.  EKG demonstrates ST depression in inferior and lateral leads which is new.  Review of Systems: Difficult to perform due to end stage alzheimer's.  Past Medical History  Diagnosis Date  . Paroxysmal atrial fibrillation   . Vertigo   . Hyperlipidemia   . Hypertension   . Depression   . Memory loss   . Degenerative arthritis   . Closed head injury   . Alzheimer's disease 07/24/2014  . Obesity    Past Surgical History  Procedure Laterality Date  . Abdominal hysterectomy    . Replacement total knee Right   . Gallbladder resection    . Humerus fracture surgery Right    Social History:  reports that she has never smoked. She has never used smokeless tobacco. She reports that she does not drink alcohol or use illicit drugs.  No Known Allergies  Family History  Problem Relation Age of Onset  . Stroke Mother   . Cancer Father   . Dementia Sister      Prior to Admission medications   Medication Sig Start Date End Date Taking? Authorizing Provider  ARIPiprazole (ABILIFY) 10 MG tablet Take 10 mg by mouth daily. 06/06/14   Historical Provider, MD  aspirin 81 MG tablet Take 81 mg by mouth daily.    Historical Provider, MD  busPIRone (BUSPAR) 5 MG tablet Take 5 mg by mouth 3 (three) times daily.    Historical Provider, MD  digoxin (LANOXIN) 0.125 MG tablet Take 1 tablet (125 mcg total) by mouth daily. 05/28/12   Ok Anis, NP  donepezil (ARICEPT) 23 MG TABS tablet  Take 1 tablet (23 mg total) by mouth at bedtime. 07/24/14   York Spaniel, MD  escitalopram (LEXAPRO) 20 MG tablet Take 20 mg by mouth daily. 07/24/14   Historical Provider, MD  LORazepam (ATIVAN) 0.5 MG tablet Take 0.5 mg by mouth at bedtime. Takes 1-2 at bedtime 07/04/14   Historical Provider, MD  nystatin cream (MYCOSTATIN) Apply 1 application topically 2 (two) times daily. 09/13/14   Bayard Beaver Saguier, PA-C  pantoprazole (PROTONIX) 40 MG tablet Take 40 mg by mouth daily. 05/18/14   Historical Provider, MD  simvastatin (ZOCOR) 40 MG tablet Take 40 mg by mouth daily. 05/18/14   Historical Provider, MD  triamterene-hydrochlorothiazide (MAXZIDE-25) 37.5-25 MG per tablet Take 0.5 tablets by mouth daily.    Historical Provider, MD  vitamin E 1000 UNIT capsule Take 800 Units by mouth daily.     Historical Provider, MD  ZETIA 10 MG tablet Take 10 mg by mouth daily.  07/10/14   Historical Provider, MD   Physical Exam: Filed Vitals:   09/13/14 1730  BP: 130/59  Pulse: 57  Temp:   Resp: 18    BP 130/59  Pulse 57  Temp(Src) 97.9 F (36.6 C) (Oral)  Resp 18  SpO2 96%  General Appearance:    Alert, demented, no distress, appears stated age  Head:    Normocephalic, atraumatic  Eyes:    PERRL, EOMI, sclera non-icteric        Nose:   Nares without drainage or epistaxis. Mucosa, turbinates normal  Throat:   Moist mucous membranes. Oropharynx without erythema or exudate.  Neck:   Supple. No carotid bruits.  No thyromegaly.  No lymphadenopathy.   Back:     No CVA tenderness, no spinal tenderness  Lungs:     Clear to auscultation bilaterally, without wheezes, rhonchi or rales  Chest wall:    No tenderness to palpitation  Heart:    Regular rate and rhythm without murmurs, gallops, rubs  Abdomen:     Soft, non-tender, nondistended, normal bowel sounds, no organomegaly  Genitalia:    deferred  Rectal:    deferred  Extremities:   Obvious edema of the left arm and indents on fingers where rings were  prior to removal.  Pulses:   2+ and symmetric all extremities  Skin:   Skin color, texture, turgor normal, no rashes or lesions  Lymph nodes:   Cervical, supraclavicular, and axillary nodes normal  Neurologic:   CNII-XII intact. Normal strength, sensation and reflexes      throughout    Labs on Admission:  Basic Metabolic Panel:  Recent Labs Lab 09/13/14 1535  NA 141  K 3.8  CL 100  CO2 28  GLUCOSE 102*  BUN 8  CREATININE 0.70  CALCIUM 9.5   Liver Function Tests:  Recent Labs Lab 09/13/14 1535  AST 17  ALT 9  ALKPHOS 68  BILITOT 0.7  PROT 7.2  ALBUMIN 3.3*   No results found for this basename: LIPASE, AMYLASE,  in the last 168 hours No results found for this basename: AMMONIA,  in the last 168 hours CBC:  Recent Labs Lab 09/13/14 1535  WBC 7.6  NEUTROABS 4.1  HGB 15.0  HCT 46.0  MCV 93.7  PLT 143*   Cardiac Enzymes:  Recent Labs Lab 09/13/14 1535  TROPONINI <0.30    BNP (last 3 results) No results found for this basename: PROBNP,  in the last 8760 hours CBG: No results found for this basename: GLUCAP,  in the last 168 hours  Radiological Exams on Admission: Dg Chest 2 View  09/13/2014   CLINICAL DATA:  Left upper extremity swelling  EXAM: CHEST  2 VIEW  COMPARISON:  None.  FINDINGS: Postsurgical changes are noted in the right humerus. The cardiac shadow is mildly enlarged. The lungs are clear bilaterally. No acute bony abnormality is seen.  IMPRESSION: No acute abnormality noted.   Electronically Signed   By: [name]   On: 09/13/2014 16:56   Dg Wrist Complete Left  09/13/2014   CLINICAL DATA:  Atraumatic left wrist pain and swelling for 2 days  EXAM: LEFT WRIST - COMPLETE 3+ VIEW  COMPARISON:  Left hand series of today's date  FINDINGS: The bones of the left wrist are mildly osteopenic. There is no acute fracture nor dislocation. There is moderate degenerative change of the first carpometacarpal joint. The radiocarpal, ulnocarpal, and  intracarpal joints are unremarkable. There is mild diffuse soft tissue swelling. There is no soft tissue gas.  IMPRESSION: There is no acute bony abnormality of the left wrist.   Electronically Signed   By: David  [place]   On: 09/13/2014 16:55   Dg Hand Complete Left  09/13/2014   CLINICAL DATA:  Left hand pain and swelling  EXAM: LEFT HAND - COMPLETE 3+ VIEW  COMPARISON:  09/13/2014  FINDINGS: Diffuse osteopenia noted. Diffuse joint space loss of the DIP and PIP joints. Sclerosis and joint space loss of the left first CMC joint. These findings are compatible with osteoarthritis. No acute fracture demonstrated. Normal alignment.  IMPRESSION: Evidence of osteoarthritis and osteopenia. No acute osseous finding.   Electronically Signed   By: [name]   On: 09/13/2014 16:53    EKG: Independently reviewed.  Assessment/Plan Active Problems:   EKG abnormality   Left arm swelling   1. EKG abnormality - 1. Patient put in under chest pain obs protocol 2. Call cards in AM 3. Serial troponins ordered 4. NPO after midnight 2. Left arm swelling -  1. Left arm is obviously edematous with indents where her rings had to be removed 2. Suspect that her arm pain onset today is more likely related to this swelling also onset today than ACS. 3. Venous duplex of arms ordered to eval for DVT. 4. Rings have been removed in ED (from both hands).   Code Status: Full Code  Family Communication: Family at bedside Disposition Plan: Admit to obs   Time spent: 70 min  Keshauna Degraffenreid M. [facility] Pager [phone]  If 7AM-7PM, please contact the day team taking care of the patient [email or web address] Password [email or web address] 09/13/2014, 7:32 PM

## 2014-09-13 NOTE — Assessment & Plan Note (Signed)
This is very difficult to assess. Patient can't give description of the pain. It was presumed that the pain was not present , she was not complaining  of pain but on palpation there is some minimal pain. She does have a history of atrial fibrillation, hypertension, hyperlipidemia and her mother did have a stroke. I did want to get EKG today but she cannot get on the examining table and therefore cannot get EKG. Felt best to send the patient to the emergency department and I did contact the charge nurse downstairs. I then took the patient downstairs with her husband. I explained presentation to the staff member working up front. Note also she might need a set of cardiac enzymes.

## 2014-09-13 NOTE — Progress Notes (Signed)
   Subjective:    Patient ID: Sheila Charles, female    DOB: [birth year], 73 y.o.   MRN: [id number]  HPI  Pt woke her husband up at 2 oclock last night. She was in pain left arm. Husband gave her tylenol. He thinks pain went way based on the fact that she was not complaining. 6 am came back as she was starting to moan about pain again. Husband gave her tylenol again and pain presumably went away since she was not reporting any.(Note however patient has Alzheimer's and can't communicate/express how she feels) . She would complain if she had pain per husband report. No trauma. No fall. No history of MI, or any strokes. History of atrial fibrillation. Hx of htn.       Pt has history of incontinence. Red irritated bottom recently. Pt tried some otc antifungal. 1 wk or raw bottom. Mild loose stool on and off but not diarrhea every time. No fever, no chills. No vomiting.    Review of Systems  Constitutional:       Husband thinks she's fatigue more so than usual.  HENT: Negative.   Respiratory:       Husband reports no respiratory problems.  Cardiovascular:        Husband reports no cardiovascular/chest symptoms.  Musculoskeletal:       Only appeared to be in pain from her left arm on awakening this morning and so later in the morning.  Neurological:       Has Alzheimer's  Psychiatric/Behavioral: Positive for confusion and decreased concentration. Negative for suicidal ideas, hallucinations, sleep disturbance and agitation. The patient is not nervous/anxious.        Objective:   Physical Exam  Constitutional: She is oriented to person, place, and time. She appears well-developed and well-nourished.  HENT:  Head: Normocephalic and atraumatic.  Eyes: Conjunctivae are normal. Pupils are equal, round, and reactive to light.  Neck: Normal range of motion. Neck supple. No JVD present. No tracheal deviation present. No thyromegaly present.  Cardiovascular: Normal rate, regular rhythm and normal  heart sounds.   When I auscultated I could not appreciate any definite atrial fibrillation.  Pulmonary/Chest: Effort normal and breath sounds normal. No stridor. No respiratory distress. She has no wheezes. She has no rales. She exhibits no tenderness.  Abdominal: Soft. Bowel sounds are normal.  Musculoskeletal:  On palpation of her left upper bicep area mild pain reported. No rash redness or warmth to her skin. No vesicle seen.  Lymphadenopathy:    She has no cervical adenopathy.  Neurological: She is alert and oriented to person, place, and time.  Very flat affect. She states very still in a chair and does not interact much at all. She cannot describe what her arm feels like.  Skin:  On her bottom between her buttocks she does have a pinkish red macerated appearance. No purulent discharge. The area on palpation does not cause any obvious pain for the patient.  Psychiatric:  Very flat affect. Moves very slowly.           Assessment & Plan:

## 2014-09-13 NOTE — Patient Instructions (Signed)
For your rash on your bottom, I am going to prescribe nystatin. For your left arm pain that was severe enough to wake you this am (still mild present now),  I will send you downstairs to get ekg and possible cardiac enzymes. You were not able to get on our examining table for ekg and I do think this test is necessary due to  your  Atrial fibrillation hx, Htn, age, mom hx of stroke and hyperlipidemia. I called down stairs to notify charge nurse you are coming down. Follow up with [place] in 7-10 days or as needed after ED evaluation.

## 2014-09-13 NOTE — Assessment & Plan Note (Signed)
Has dermatitis between her buttocks likely from occasional loose stools and use of depends. I prescribed nystatin cream to apply twice daily and instructed to try to keep the area dry. This area persist notify us. Note I did make a refill of nystatin available.

## 2014-09-14 ENCOUNTER — Encounter (HOSPITAL_COMMUNITY): Payer: Self-pay | Admitting: General Practice

## 2014-09-14 DIAGNOSIS — M7989 Other specified soft tissue disorders: Secondary | ICD-10-CM

## 2014-09-14 DIAGNOSIS — F0280 Dementia in other diseases classified elsewhere without behavioral disturbance: Secondary | ICD-10-CM

## 2014-09-14 DIAGNOSIS — G309 Alzheimer's disease, unspecified: Secondary | ICD-10-CM

## 2014-09-14 LAB — TROPONIN I

## 2014-09-14 MED ORDER — INFLUENZA VAC SPLIT QUAD 0.5 ML IM SUSY
0.5000 mL | PREFILLED_SYRINGE | INTRAMUSCULAR | Status: DC
  Filled 2014-09-14: qty 0.5

## 2014-09-14 MED ORDER — INFLUENZA VAC SPLIT QUAD 0.5 ML IM SUSY
0.5000 mL | PREFILLED_SYRINGE | Freq: Once | INTRAMUSCULAR | Status: AC
  Administered 2014-09-14: 0.5 mL via INTRAMUSCULAR
  Filled 2014-09-14: qty 0.5

## 2014-09-14 NOTE — Care Management Note (Signed)
    Page 1 of 2   09/14/2014     11:02:38 AM CARE MANAGEMENT NOTE 09/14/2014  Patient:  Sheila Charles, Sheila Charles   Account Number:  [id number]  Date Initiated:  09/14/2014  Documentation initiated by:  GRAVES-BIGELOW,Almeta Geisel  Subjective/Objective Assessment:   Pt admitted for arm pain and end stage dementia. Pt is from hom with husband.     Action/Plan:   CM spoke to husband and pt is active with [facility] for RN, PT services. MD please write resumption orders with f20f once stable for d/c. CM provided husband with number to [facility] and personal care service agency list.   Anticipated DC Date:  09/15/2014   Anticipated DC Plan:  HOME W HOME HEALTH SERVICES      DC Planning Services  CM consult      [facility] Choice  Resumption Of Svcs/PTA Provider   Choice offered to / List presented to:  C-3 Spouse        HH arranged  HH-1 RN  HH-10 DISEASE MANAGEMENT  HH-2 PT  HH-3 OT      [facility] agency  [facility]   Status of service:  Completed, signed off Medicare Important Message given?  NO (If response is "NO", the following Medicare IM given date fields will be blank) Date Medicare IM given:   Medicare IM given by:   Date Additional Medicare IM given:   Additional Medicare IM given by:    Discharge Disposition:  HOME W HOME HEALTH SERVICES  Per UR Regulation:  Reviewed for med. necessity/level of care/duration of stay  If discussed at Long Length of Stay Meetings, dates discussed:    Comments:  09-14-14 1034 Tomi Bamberger, RN,BSN [phone] CM did call [facility] to make them aware that pt is here in hospital. Husband had concerns with not being able to pay out of pocket for personal care providers. CM did call the [facility] Response Program and the Aids visit for bath and will not be able to sit with pt for extended amount of time. CM did provide pt with information to the Adult Enrichment Program. CM did speak to Liaison for [facility] and the CSW with them will  continue to assist with additional resources for possible Respite Care. No further needs from CM at this time.

## 2014-09-14 NOTE — Progress Notes (Signed)
Reviewed discharge instructions with patient's husband and son and they stated their understanding.  Discharged home with husband via wheelchair.   Filed Vitals:   09/14/14 1340  BP: 113/60  Pulse: 71  Temp: 98.3 F (36.8 C)  Resp: 18   Teancum Brule, Neoma Laming

## 2014-09-14 NOTE — Discharge Summary (Signed)
Physician Discharge Summary  Sheila Charles WUJ:[id number] DOB: 1941/09/01 DOA: 09/13/2014  PCP: Sheila Rhymes, MD  Admit date: 09/13/2014 Discharge date: 09/14/2014  Time spent: >35 minutes  Recommendations for Outpatient Follow-up:  HHC F/u with PCP in 1 week   Discharge Diagnoses:  Active Problems:   EKG abnormality   Left arm swelling   Discharge Condition: stable   Diet recommendation: low sodium  Filed Weights   09/14/14 0525  Weight: 96.026 kg (211 lb 11.2 oz)    History of present illness:  73 y.o. female with end stage dementia who is brought in by family for L arm pain, swelling  -Pain is associated with edema of the entire left arm. Her rings had to be removed in the [facility] ED. EKG demonstrates ST depression in inferior and lateral leads   Hospital Course:  1. L arm edema, probable dependant edema; Pt not very ambulatory at baseline; suspected underling DJD related pain  -[place] no DVT; cont pain control  2. L arm pain, denies chest pain; trop negative; patient is seen evaluated by cardiology, who recommended conservative management  3. End stage dementia; likely needs hospice care eval as outpatient     Procedures:  no (i.e. Studies not automatically included, echos, thoracentesis, etc; not x-rays)  Consultations:  Cardiology   Discharge Exam: Filed Vitals:   09/14/14 1340  BP: 113/60  Pulse: 71  Temp: 98.3 F (36.8 C)  Resp: 18    General: alert, confused at baseline  Cardiovascular: s1,s2 rrr Respiratory: CTA BL  Discharge Instructions  Discharge Instructions   Diet - low sodium heart healthy    Complete by:  As directed      Discharge instructions    Complete by:  As directed   Please follow up with primary care doctor in 1 week     Increase activity slowly    Complete by:  As directed             Medication List         ARIPiprazole 10 MG tablet  Commonly known as:  ABILIFY  Take 10 mg by mouth at bedtime.     aspirin EC 81 MG  tablet  Take 81 mg by mouth at bedtime.     digoxin 0.125 MG tablet  Commonly known as:  LANOXIN  Take 0.0625 mg by mouth daily.     donepezil 23 MG Tabs tablet  Commonly known as:  ARICEPT  Take 1 tablet (23 mg total) by mouth at bedtime.     escitalopram 20 MG tablet  Commonly known as:  LEXAPRO  Take 20 mg by mouth daily.     ezetimibe 10 MG tablet  Commonly known as:  ZETIA  Take 10 mg by mouth at bedtime.     LORazepam 0.5 MG tablet  Commonly known as:  ATIVAN  Take 0.5-1 mg by mouth at bedtime. Takes 1-2 at bedtime     nystatin cream  Commonly known as:  MYCOSTATIN  Apply 1 application topically 2 (two) times daily.     pantoprazole 40 MG tablet  Commonly known as:  PROTONIX  Take 40 mg by mouth daily.     simvastatin 40 MG tablet  Commonly known as:  ZOCOR  Take 40 mg by mouth at bedtime.     triamterene-hydrochlorothiazide 37.5-25 MG per tablet  Commonly known as:  MAXZIDE-25  Take 0.5 tablets by mouth daily.     vitamin E 400 UNIT capsule  Take 800 Units by  mouth at bedtime.       No Known Allergies     Follow-up Information   Follow up with [facility]. Social research officer, government, Physical Therapy and Child psychotherapist)    Specialty:  Home Health Services   Contact information:   [address] [place] [address] [phone]       Follow up with Sheila Rhymes, MD In 1 week.   Specialty:  Family Medicine   Contact information:   [address] [place] [address] [phone]        The results of significant diagnostics from this hospitalization (including imaging, microbiology, ancillary and laboratory) are listed below for reference.    Significant Diagnostic Studies: Dg Chest 2 View  09/13/2014   CLINICAL DATA:  Left upper extremity swelling  EXAM: CHEST  2 VIEW  COMPARISON:  None.  FINDINGS: Postsurgical changes are noted in the right humerus. The cardiac shadow is mildly enlarged. The lungs are clear  bilaterally. No acute bony abnormality is seen.  IMPRESSION: No acute abnormality noted.   Electronically Signed   By: [name]   On: 09/13/2014 16:56   Dg Wrist Complete Left  09/13/2014   CLINICAL DATA:  Atraumatic left wrist pain and swelling for 2 days  EXAM: LEFT WRIST - COMPLETE 3+ VIEW  COMPARISON:  Left hand series of today's date  FINDINGS: The bones of the left wrist are mildly osteopenic. There is no acute fracture nor dislocation. There is moderate degenerative change of the first carpometacarpal joint. The radiocarpal, ulnocarpal, and intracarpal joints are unremarkable. There is mild diffuse soft tissue swelling. There is no soft tissue gas.  IMPRESSION: There is no acute bony abnormality of the left wrist.   Electronically Signed   By: David  [place]   On: 09/13/2014 16:55   Dg Hand Complete Left  09/13/2014   CLINICAL DATA:  Left hand pain and swelling  EXAM: LEFT HAND - COMPLETE 3+ VIEW  COMPARISON:  09/13/2014  FINDINGS: Diffuse osteopenia noted. Diffuse joint space loss of the DIP and PIP joints. Sclerosis and joint space loss of the left first CMC joint. These findings are compatible with osteoarthritis. No acute fracture demonstrated. Normal alignment.  IMPRESSION: Evidence of osteoarthritis and osteopenia. No acute osseous finding.   Electronically Signed   By: [name]   On: 09/13/2014 16:53    Microbiology: No results found for this or any previous visit (from the past 240 hour(s)).   Labs: Basic Metabolic Panel:  Recent Labs Lab 09/13/14 1535  NA 141  K 3.8  CL 100  CO2 28  GLUCOSE 102*  BUN 8  CREATININE 0.70  CALCIUM 9.5   Liver Function Tests:  Recent Labs Lab 09/13/14 1535  AST 17  ALT 9  ALKPHOS 68  BILITOT 0.7  PROT 7.2  ALBUMIN 3.3*   No results found for this basename: LIPASE, AMYLASE,  in the last 168 hours No results found for this basename: AMMONIA,  in the last 168 hours CBC:  Recent Labs Lab 09/13/14 1535  WBC 7.6   NEUTROABS 4.1  HGB 15.0  HCT 46.0  MCV 93.7  PLT 143*   Cardiac Enzymes:  Recent Labs Lab 09/13/14 1535 09/13/14 2140 09/14/14 0115  TROPONINI <0.30 <0.30 <0.30   BNP: BNP (last 3 results) No results found for this basename: PROBNP,  in the last 8760 hours CBG: No results found for this basename: GLUCAP,  in the last 168 hours  SignedEsperanza Sheets  [facility] 09/14/2014, 4:56 PM

## 2014-09-14 NOTE — Progress Notes (Signed)
VASCULAR LAB PRELIMINARY  PRELIMINARY  PRELIMINARY  PRELIMINARY  Bilateral upper extremity venous duplex completed.    Preliminary report:  Bilateral:  No evidence of DVT or superficial thrombosis.  Ziad Maye, RVT 09/14/2014, 4:34 PM

## 2014-09-14 NOTE — Discharge Instructions (Signed)

## 2014-09-14 NOTE — Progress Notes (Signed)
Utilization Review Completed.Sheila Charles T9/17/2015  

## 2014-09-14 NOTE — Progress Notes (Signed)
[facility] PROGRESS NOTE  Sheila Charles ONG:[id number] DOB: [birth year] DOA: 09/13/2014 PCP: [name], MD  Assessment/Plan: 73 y.o. female with end stage dementia who is brought in by family for L arm pain, swelling  -Pain is associated with edema of the entire left arm. Her rings had to be removed in the [facility] ED. EKG demonstrates ST depression in inferior and lateral leads  1. L arm edema, probable dependant edema; Pt not very ambulatory at baseline; suspected underling DJD related pain  -pend [place] r/o DVT; cont pain control   2. L arm pain, denies chest pain; trop negative; patient is seen evaluated by cardiology, who recommended conservative management   3. End stage dementia; likely needs hospice care eval as outpatient   D/c home today pend [place] to r/o DVT   Code Status: full Family Communication: d/w patient, her husband  (indicate person spoken with, relationship, and if by phone, the number) Disposition Plan: home 24-478 hrs    Consultants:  Cardiology   Procedures:  none  Antibiotics:  none (indicate start date, and stop date if known)  HPI/Subjective: alert  Objective: Filed Vitals:   09/14/14 0525  BP: 128/63  Pulse: 81  Temp: 98 F (36.7 C)  Resp: 18   No intake or output data in the 24 hours ending 09/14/14 1248 Filed Weights   09/14/14 0525  Weight: 96.026 kg (211 lb 11.2 oz)    Exam:   General:  Alert, confused  Cardiovascular: s1,s2 rrr  Respiratory: CTA BL  Abdomen: soft, nt,nd   Musculoskeletal: no LE edema   Data Reviewed: Basic Metabolic Panel:  Recent Labs Lab 09/13/14 1535  NA 141  K 3.8  CL 100  CO2 28  GLUCOSE 102*  BUN 8  CREATININE 0.70  CALCIUM 9.5   Liver Function Tests:  Recent Labs Lab 09/13/14 1535  AST 17  ALT 9  ALKPHOS 68  BILITOT 0.7  PROT 7.2  ALBUMIN 3.3*   No results found for this basename: LIPASE, AMYLASE,  in the last 168 hours No results found for this basename: AMMONIA,  in  the last 168 hours CBC:  Recent Labs Lab 09/13/14 1535  WBC 7.6  NEUTROABS 4.1  HGB 15.0  HCT 46.0  MCV 93.7  PLT 143*   Cardiac Enzymes:  Recent Labs Lab 09/13/14 1535 09/13/14 2140 09/14/14 0115  TROPONINI <0.30 <0.30 <0.30   BNP (last 3 results) No results found for this basename: PROBNP,  in the last 8760 hours CBG: No results found for this basename: GLUCAP,  in the last 168 hours  No results found for this or any previous visit (from the past 240 hour(s)).   Studies: Dg Chest 2 View  09/13/2014   CLINICAL DATA:  Left upper extremity swelling  EXAM: CHEST  2 VIEW  COMPARISON:  None.  FINDINGS: Postsurgical changes are noted in the right humerus. The cardiac shadow is mildly enlarged. The lungs are clear bilaterally. No acute bony abnormality is seen.  IMPRESSION: No acute abnormality noted.   Electronically Signed   By: [name]   On: 09/13/2014 16:56   Dg Wrist Complete Left  09/13/2014   CLINICAL DATA:  Atraumatic left wrist pain and swelling for 2 days  EXAM: LEFT WRIST - COMPLETE 3+ VIEW  COMPARISON:  Left hand series of today's date  FINDINGS: The bones of the left wrist are mildly osteopenic. There is no acute fracture nor dislocation. There is moderate degenerative change of the first  carpometacarpal joint. The radiocarpal, ulnocarpal, and intracarpal joints are unremarkable. There is mild diffuse soft tissue swelling. There is no soft tissue gas.  IMPRESSION: There is no acute bony abnormality of the left wrist.   Electronically Signed   By: David  [place]   On: 09/13/2014 16:55   Dg Hand Complete Left  09/13/2014   CLINICAL DATA:  Left hand pain and swelling  EXAM: LEFT HAND - COMPLETE 3+ VIEW  COMPARISON:  09/13/2014  FINDINGS: Diffuse osteopenia noted. Diffuse joint space loss of the DIP and PIP joints. Sclerosis and joint space loss of the left first CMC joint. These findings are compatible with osteoarthritis. No acute fracture demonstrated. Normal  alignment.  IMPRESSION: Evidence of osteoarthritis and osteopenia. No acute osseous finding.   Electronically Signed   By: [name]   On: 09/13/2014 16:53    Scheduled Meds: . ARIPiprazole  10 mg Oral Daily  . aspirin EC  81 mg Oral Daily  . digoxin  62.5 mcg Oral Daily  . donepezil  23 mg Oral QHS  . escitalopram  20 mg Oral Daily  . ezetimibe  10 mg Oral Daily  . heparin  5,000 Units Subcutaneous 3 times per day  . LORazepam  0.5 mg Oral QHS  . nystatin cream  1 application Topical BID  . pantoprazole  40 mg Oral Daily  . simvastatin  40 mg Oral q1800  . triamterene-hydrochlorothiazide  0.5 tablet Oral Daily  . vitamin E  800 Units Oral Daily   Continuous Infusions:   Active Problems:   EKG abnormality   Left arm swelling    Time spent: >35 minutes     Esperanza Sheets  [facility] Pager [phone]. If 7PM-7AM, please contact night-coverage at [email or web address], password [facility] 09/14/2014, 12:48 PM  LOS: 1 day

## 2014-09-14 NOTE — Progress Notes (Signed)
Chart reviewed as patient is on CP unit.  Given severe dementia left arm pain (no chest pain) with left arm swelling as well as negative cardiac enzymes, would not do further cardiac workup at this time.  Would aim for conservative treatment.    Marca Ancona 09/14/2014

## 2014-09-15 ENCOUNTER — Telehealth: Payer: Self-pay

## 2014-09-15 NOTE — Telephone Encounter (Signed)
Admit date: 09/13/2014  Discharge date: 09/14/2014  Reason for admission:  Abnormal EKG with Left arm pain and swelling   Recommendations for Outpatient Follow-up:  HHC  F/u with PCP in 1 week   Information was provided by patient's husband given her medical hx of end stage dementia  Transition Care Management Follow-up Telephone Call  How have you been since you were released from the hospital?  According to patient's husband, Sheila Charles' has very little energy.  She not eating much, but is staying hydrated.  Her left arm/hand is still swollen and the couple is still unclear of the cause or reason for swelling.  No complaints of pain.      Do you understand why you were in the hospital? yes, but no diagnosis was given   Do you understand the discharge instrcutions? yes  Items Reviewed:  Medications reviewed: yes  Allergies reviewed: yes  Dietary changes reviewed: yes; low sodium, heart healthy.  Husband prepares the meals.  Referrals reviewed: yes, [facility] will be providing home health services (RN/PT/SW) x 8 weeks.  Hospice also came out to evaluate patient and it was determined that at this time the patient did not qualify for their services.   Functional Questionnaire:   Activities of Daily Living (ADLs):   She states they are independent in the following: requires assistance from husband for much of her care States they require assistance with the following: ambulation, bathing and hygiene, feeding, continence, grooming, toileting and dressing   Any transportation issues/concerns?: no, husband provides transportation   Any patient concerns? yes, husband would like more help with caring for his wife as much of her care depends on him.   Confirmed importance and date/time of follow-up visits scheduled: yes   Confirmed with patient if condition begins to worsen call PCP or go to the ER.  Patient was given the Call-a-Nurse line [phone]: yes   Hospital Follow  Up appointment scheduled for 09/19/14 @ 11:30 am.

## 2014-09-19 ENCOUNTER — Ambulatory Visit (INDEPENDENT_AMBULATORY_CARE_PROVIDER_SITE_OTHER): Payer: Medicare HMO | Admitting: Family Medicine

## 2014-09-19 ENCOUNTER — Encounter: Payer: Self-pay | Admitting: Family Medicine

## 2014-09-19 VITALS — BP 128/78 | HR 68 | Temp 97.8°F | Resp 17

## 2014-09-19 DIAGNOSIS — F0280 Dementia in other diseases classified elsewhere without behavioral disturbance: Secondary | ICD-10-CM

## 2014-09-19 DIAGNOSIS — G309 Alzheimer's disease, unspecified: Secondary | ICD-10-CM

## 2014-09-19 DIAGNOSIS — F411 Generalized anxiety disorder: Secondary | ICD-10-CM

## 2014-09-19 DIAGNOSIS — M7989 Other specified soft tissue disorders: Secondary | ICD-10-CM

## 2014-09-19 DIAGNOSIS — B369 Superficial mycosis, unspecified: Secondary | ICD-10-CM

## 2014-09-19 MED ORDER — BUSPIRONE HCL 5 MG PO TABS: 5.0000 mg | ORAL_TABLET | Freq: Two times a day (BID) | ORAL | Status: DC

## 2014-09-19 NOTE — Assessment & Plan Note (Signed)
Husband reports this has worsened since previous PCP stopped pt's Buspar.  He is asking to restart this today.  Will restart previous dose of  BID.

## 2014-09-19 NOTE — Patient Instructions (Signed)
Follow up in 6 months to recheck cholesterol and BP We'll send an order for wound care Restart the Buspar twice daily ( ) Try and limit her salt intake, increase water intake (soda also has sodium), and try and avoid having her hands in her laps at all time She does have arthritis in her hands/wrists- it's ok to take tylenol as needed for pain Call with any questions or concerns Hang in there!

## 2014-09-19 NOTE — Assessment & Plan Note (Signed)
Husband reports pt's arm swelling has improved, as has the pain.  Reviewed that increased water intake, limited dietary Na intake, and moving arms regularly will decrease her dependent edema.  Husband expressed understanding.

## 2014-09-19 NOTE — Assessment & Plan Note (Signed)
New.  Husband is applying nystatin twice daily to bottom and attempting to keep pt clean and dry but due to her incontinence, this is very difficult.  Husband not able to lift or move pt w/o considerable difficulty and risk to himself.  Will add wound care to [facility] orders.

## 2014-09-19 NOTE — Progress Notes (Signed)
Pre visit review using our clinic review tool, if applicable. No additional management support is needed unless otherwise documented below in the visit note. 

## 2014-09-19 NOTE — Progress Notes (Signed)
   Subjective:    Patient ID: Sheila Charles, female    DOB: 01/31/1941, 73 y.o.   MRN: [id number]  HPI Hospital f/u- pt was admitted on 9/16 for L arm edema and pain and abnormal EKG.  Pt had negative Troponin x3 so cardiac cause was ruled out.  It was felt that cause of edema was due to inactivity and was dependent edema.  (-) doppler.  Pt eats a lot of hot dogs and sauerkraut- high Na.  Eats [employer] baked beans regularly.  Husband reports pain and swelling have improved since d/c.  Pt w/ xrays done of hand and wrist showing arthritis.  No CP, SOB.  Husband reports pt w/ fungal rash in gluteal cleft which is being treated w/ nystatin but is now open and irritated due to pt's ongoing incontinence and difficulty w/ keeping area clean.   Review of Systems For ROS see HPI     Objective:   Physical Exam  Vitals reviewed. Constitutional: She appears well-developed and well-nourished. No distress.  Flat affect.  Sitting in wheel chair w/ legs nearly continuous leg bouncing  HENT:  Head: Normocephalic and atraumatic.  Cardiovascular: Normal rate, regular rhythm, normal heart sounds and intact distal pulses.   Pulmonary/Chest: Effort normal and breath sounds normal. No respiratory distress. She has no wheezes. She has no rales.  Musculoskeletal: She exhibits edema (bilteral LE and UE edema).  Skin: Skin is warm and dry.  Unable to view fungal dermatitis due to pt's inability to get out of wheelchair in office  Psychiatric:  Flat affect.  Not responding to questions.  Near constat leg bouncing which husband reports is her sign of anxiety          Assessment & Plan:

## 2014-09-19 NOTE — Assessment & Plan Note (Signed)
Severe.  Hospital team recommended outpt Hospice evaluation.  This was already done and we were told that pt does not qualify b/c they estimate she will survive longer than 6 months (which is the admission criteria).  Provided letter for husband to attempt to get home services.  Will follow.

## 2014-09-21 ENCOUNTER — Telehealth: Payer: Self-pay

## 2014-09-21 DIAGNOSIS — M6281 Muscle weakness (generalized): Secondary | ICD-10-CM

## 2014-09-21 DIAGNOSIS — I4891 Unspecified atrial fibrillation: Secondary | ICD-10-CM

## 2014-09-21 DIAGNOSIS — I10 Essential (primary) hypertension: Secondary | ICD-10-CM

## 2014-09-21 DIAGNOSIS — F0280 Dementia in other diseases classified elsewhere without behavioral disturbance: Secondary | ICD-10-CM

## 2014-09-21 DIAGNOSIS — G309 Alzheimer's disease, unspecified: Secondary | ICD-10-CM

## 2014-09-21 NOTE — Telephone Encounter (Signed)
Received via fax.  Forms reviewed and signed by Dr. Beverely Low.  Form faxed to [facility] at [phone].  Fax confirmation received.  Forms labeled and placed in scan basket for scanning.  Billing sheet placed in red billing folder.

## 2014-10-02 ENCOUNTER — Telehealth: Payer: Self-pay | Admitting: Family Medicine

## 2014-10-02 ENCOUNTER — Other Ambulatory Visit: Payer: Self-pay | Admitting: General Practice

## 2014-10-02 MED ORDER — HYDROCODONE-ACETAMINOPHEN 5-325 MG PO TABS: ORAL_TABLET | ORAL | Status: DC

## 2014-10-02 MED ORDER — BUSPIRONE HCL 5 MG PO TABS
5.0000 mg | ORAL_TABLET | Freq: Two times a day (BID) | ORAL | Status: DC
Start: 2014-10-02 — End: 2014-10-03

## 2014-10-02 NOTE — Telephone Encounter (Signed)
Called pt husband to verify:  how long pain has been present? How severe on 0-10 scale is pain? Where is pain located?

## 2014-10-02 NOTE — Telephone Encounter (Signed)
Med filled and Rx printed for American FinancialEdward Saguier PA-C to sign due to Beverely Lowabori being out of office. Pt husband notified.

## 2014-10-02 NOTE — Telephone Encounter (Signed)
Spoke with pt husband who advised that pt's hands are causing the pain. Pt woke up at 2am shaking and trembling in pain. Pt husband stated that tylenol arthritis is not working so he gave pt a left over hydrocodone/Acetamenaphine 7.5-325mg  from his gall bladder surgery.   Please advise if ok for pain meds.

## 2014-10-02 NOTE — Telephone Encounter (Signed)
Yes, alleviated it almost immediately.

## 2014-10-02 NOTE — Telephone Encounter (Signed)
Great!  Proceed w/ hydrocodone script for pt

## 2014-10-02 NOTE — Telephone Encounter (Signed)
Ok for hydrocodone 5/325mg  1 tab q4-6 prn, #60.  Did pain meds help w/ pt's pain?

## 2014-10-02 NOTE — Telephone Encounter (Signed)
Last OV 09/19/14 No record of us ever filling lorazepam. Please advise on the quantity and if there should be any changes to the SIG.

## 2014-10-02 NOTE — Telephone Encounter (Signed)
Caller name: Clarenace Relation to pt: self  Call back number: [phone] Pharmacy: [id number] - [place], [place] - [address] [phone]    Reason for call: spouse states wife is in severe pain and tylenol is not working requesting pain medication.

## 2014-10-03 ENCOUNTER — Other Ambulatory Visit: Payer: Self-pay | Admitting: General Practice

## 2014-10-03 MED ORDER — BUSPIRONE HCL 5 MG PO TABS: 5.0000 mg | ORAL_TABLET | Freq: Three times a day (TID) | ORAL | Status: DC

## 2014-10-03 MED ORDER — LORAZEPAM 0.5 MG PO TABS: 0.5000 mg | ORAL_TABLET | Freq: Every day | ORAL | Status: DC

## 2014-10-03 NOTE — Telephone Encounter (Signed)
Med filled and faxed.  

## 2014-10-04 ENCOUNTER — Telehealth: Payer: Self-pay | Admitting: Family Medicine

## 2014-10-04 NOTE — Telephone Encounter (Signed)
Medication called in 

## 2014-10-04 NOTE — Telephone Encounter (Signed)
Caller name: Bastidas,Clarence Relation to pt: spouse  Call back number: [phone] [phone]   Reason for call: PT spouse states pharamcy did not receive LORazepam (ATIVAN) 0.5 MG tablet

## 2014-10-04 NOTE — Telephone Encounter (Signed)
Caller name: Scaduto,Clarence Relation to pt: spouse  Call back number: [phone] [phone]  Reason for call:   [phone] or a verbal order [phone] regarding patient Ativan pharmacy states verbal would be the quickest way as of now pharmacy has not received rx

## 2014-10-04 NOTE — Telephone Encounter (Signed)
Medication faxed last night went through and refaxed this am.

## 2014-10-13 DIAGNOSIS — M7989 Other specified soft tissue disorders: Secondary | ICD-10-CM

## 2014-10-13 DIAGNOSIS — G309 Alzheimer's disease, unspecified: Secondary | ICD-10-CM

## 2014-10-13 DIAGNOSIS — B369 Superficial mycosis, unspecified: Secondary | ICD-10-CM

## 2014-10-13 DIAGNOSIS — F411 Generalized anxiety disorder: Secondary | ICD-10-CM

## 2014-10-16 ENCOUNTER — Other Ambulatory Visit: Payer: Self-pay | Admitting: General Practice

## 2014-10-16 MED ORDER — EZETIMIBE 10 MG PO TABS: 10.0000 mg | ORAL_TABLET | Freq: Every day | ORAL | Status: DC

## 2014-10-18 ENCOUNTER — Other Ambulatory Visit: Payer: Self-pay | Admitting: General Practice

## 2014-10-18 MED ORDER — EZETIMIBE 10 MG PO TABS: 10.0000 mg | ORAL_TABLET | Freq: Every day | ORAL | Status: DC

## 2014-11-07 ENCOUNTER — Telehealth: Payer: Self-pay | Admitting: Family Medicine

## 2014-11-07 NOTE — Telephone Encounter (Signed)
Unable to prescribe medication w/o assessment of pt and her sxs

## 2014-11-07 NOTE — Telephone Encounter (Signed)
Per Pt spouse he could not bring pt to our office but he was going to take her to UC. Did not want to wait until tomorrow.

## 2014-11-07 NOTE — Telephone Encounter (Signed)
Caller name: Sather,Clarence °Relation to pt: spouse  °Call back number: [phone] °[phone] ° °Reason for call:  ° °Soar throat and nose running, ache feeling; spouse states he's wife is a lot to manage to bring her in for an acute visit requesting a rx. (advised pt to schedule an acute appointment so pcp can acccess and insisted cma calls him.  ° ° °

## 2014-11-08 ENCOUNTER — Other Ambulatory Visit: Payer: Self-pay | Admitting: Family Medicine

## 2014-11-08 NOTE — Telephone Encounter (Signed)
Med filled.  

## 2014-11-09 ENCOUNTER — Emergency Department (HOSPITAL_COMMUNITY): Payer: Medicare HMO

## 2014-11-09 ENCOUNTER — Inpatient Hospital Stay (HOSPITAL_COMMUNITY)
Admission: EM | Admit: 2014-11-09 | Discharge: 2014-11-12 | DRG: 291 | Disposition: A | Discharge status: Hospice | Payer: Medicare HMO | Attending: Internal Medicine | Admitting: Internal Medicine

## 2014-11-09 ENCOUNTER — Encounter (HOSPITAL_COMMUNITY): Payer: Self-pay

## 2014-11-09 ENCOUNTER — Telehealth: Payer: Self-pay | Admitting: *Deleted

## 2014-11-09 DIAGNOSIS — Z79899 Other long term (current) drug therapy: Secondary | ICD-10-CM | POA: Diagnosis not present

## 2014-11-09 DIAGNOSIS — R05 Cough: Secondary | ICD-10-CM

## 2014-11-09 DIAGNOSIS — J029 Acute pharyngitis, unspecified: Secondary | ICD-10-CM | POA: Diagnosis present

## 2014-11-09 DIAGNOSIS — F0280 Dementia in other diseases classified elsewhere without behavioral disturbance: Secondary | ICD-10-CM | POA: Diagnosis present

## 2014-11-09 DIAGNOSIS — I4891 Unspecified atrial fibrillation: Secondary | ICD-10-CM | POA: Diagnosis present

## 2014-11-09 DIAGNOSIS — E46 Unspecified protein-calorie malnutrition: Secondary | ICD-10-CM | POA: Diagnosis present

## 2014-11-09 DIAGNOSIS — G309 Alzheimer's disease, unspecified: Secondary | ICD-10-CM

## 2014-11-09 DIAGNOSIS — J9811 Atelectasis: Secondary | ICD-10-CM | POA: Diagnosis present

## 2014-11-09 DIAGNOSIS — R159 Full incontinence of feces: Secondary | ICD-10-CM | POA: Diagnosis present

## 2014-11-09 DIAGNOSIS — J209 Acute bronchitis, unspecified: Secondary | ICD-10-CM | POA: Diagnosis present

## 2014-11-09 DIAGNOSIS — Z7401 Bed confinement status: Secondary | ICD-10-CM

## 2014-11-09 DIAGNOSIS — B369 Superficial mycosis, unspecified: Secondary | ICD-10-CM

## 2014-11-09 DIAGNOSIS — I10 Essential (primary) hypertension: Secondary | ICD-10-CM | POA: Diagnosis present

## 2014-11-09 DIAGNOSIS — Z7982 Long term (current) use of aspirin: Secondary | ICD-10-CM

## 2014-11-09 DIAGNOSIS — F329 Major depressive disorder, single episode, unspecified: Secondary | ICD-10-CM | POA: Diagnosis present

## 2014-11-09 DIAGNOSIS — Z6836 Body mass index (BMI) 36.0-36.9, adult: Secondary | ICD-10-CM

## 2014-11-09 DIAGNOSIS — Z79891 Long term (current) use of opiate analgesic: Secondary | ICD-10-CM | POA: Diagnosis not present

## 2014-11-09 DIAGNOSIS — E669 Obesity, unspecified: Secondary | ICD-10-CM | POA: Diagnosis present

## 2014-11-09 DIAGNOSIS — M1990 Unspecified osteoarthritis, unspecified site: Secondary | ICD-10-CM | POA: Diagnosis present

## 2014-11-09 DIAGNOSIS — E785 Hyperlipidemia, unspecified: Secondary | ICD-10-CM | POA: Diagnosis present

## 2014-11-09 DIAGNOSIS — Z823 Family history of stroke: Secondary | ICD-10-CM | POA: Diagnosis not present

## 2014-11-09 DIAGNOSIS — I509 Heart failure, unspecified: Secondary | ICD-10-CM

## 2014-11-09 DIAGNOSIS — Z809 Family history of malignant neoplasm, unspecified: Secondary | ICD-10-CM | POA: Diagnosis not present

## 2014-11-09 DIAGNOSIS — I480 Paroxysmal atrial fibrillation: Secondary | ICD-10-CM | POA: Diagnosis present

## 2014-11-09 DIAGNOSIS — Z515 Encounter for palliative care: Secondary | ICD-10-CM | POA: Diagnosis not present

## 2014-11-09 DIAGNOSIS — R9431 Abnormal electrocardiogram [ECG] [EKG]: Secondary | ICD-10-CM | POA: Diagnosis present

## 2014-11-09 DIAGNOSIS — I5033 Acute on chronic diastolic (congestive) heart failure: Secondary | ICD-10-CM | POA: Insufficient documentation

## 2014-11-09 DIAGNOSIS — R32 Unspecified urinary incontinence: Secondary | ICD-10-CM | POA: Diagnosis present

## 2014-11-09 DIAGNOSIS — R059 Cough, unspecified: Secondary | ICD-10-CM

## 2014-11-09 DIAGNOSIS — E43 Unspecified severe protein-calorie malnutrition: Secondary | ICD-10-CM | POA: Diagnosis present

## 2014-11-09 DIAGNOSIS — Z87440 Personal history of urinary (tract) infections: Secondary | ICD-10-CM | POA: Diagnosis not present

## 2014-11-09 DIAGNOSIS — I5031 Acute diastolic (congestive) heart failure: Secondary | ICD-10-CM | POA: Diagnosis present

## 2014-11-09 DIAGNOSIS — M2550 Pain in unspecified joint: Secondary | ICD-10-CM | POA: Insufficient documentation

## 2014-11-09 DIAGNOSIS — E876 Hypokalemia: Secondary | ICD-10-CM | POA: Diagnosis present

## 2014-11-09 LAB — CBC WITH DIFFERENTIAL/PLATELET
BASOS ABS: 0 10*3/uL (ref 0.0–0.1)
BASOS PCT: 0 % (ref 0–1)
EOS ABS: 0.1 10*3/uL (ref 0.0–0.7)
Eosinophils Relative: 1 % (ref 0–5)
HCT: 46.6 % — ABNORMAL HIGH (ref 36.0–46.0)
Hemoglobin: 15.2 g/dL — ABNORMAL HIGH (ref 12.0–15.0)
Lymphocytes Relative: 16 % (ref 12–46)
Lymphs Abs: 1.2 10*3/uL (ref 0.7–4.0)
MCH: 30.4 pg (ref 26.0–34.0)
MCHC: 32.6 g/dL (ref 30.0–36.0)
MCV: 93.2 fL (ref 78.0–100.0)
MONO ABS: 0.9 10*3/uL (ref 0.1–1.0)
Monocytes Relative: 13 % — ABNORMAL HIGH (ref 3–12)
Neutro Abs: 5.1 10*3/uL (ref 1.7–7.7)
Neutrophils Relative %: 70 % (ref 43–77)
Platelets: 112 10*3/uL — ABNORMAL LOW (ref 150–400)
RBC: 5 MIL/uL (ref 3.87–5.11)
RDW: 13.5 % (ref 11.5–15.5)
WBC: 7.3 10*3/uL (ref 4.0–10.5)

## 2014-11-09 LAB — URINALYSIS, ROUTINE W REFLEX MICROSCOPIC
Glucose, UA: NEGATIVE mg/dL
Hgb urine dipstick: NEGATIVE
KETONES UR: NEGATIVE mg/dL
Leukocytes, UA: NEGATIVE
Nitrite: NEGATIVE
PH: 7 (ref 5.0–8.0)
PROTEIN: NEGATIVE mg/dL
Specific Gravity, Urine: 1.019 (ref 1.005–1.030)
Urobilinogen, UA: 2 mg/dL — ABNORMAL HIGH (ref 0.0–1.0)

## 2014-11-09 LAB — I-STAT TROPONIN, ED: Troponin i, poc: 0.01 ng/mL (ref 0.00–0.08)

## 2014-11-09 LAB — BASIC METABOLIC PANEL
Anion gap: 15 (ref 5–15)
BUN: 14 mg/dL (ref 6–23)
CO2: 26 meq/L (ref 19–32)
CREATININE: 0.83 mg/dL (ref 0.50–1.10)
Calcium: 9.5 mg/dL (ref 8.4–10.5)
Chloride: 98 mEq/L (ref 96–112)
GFR calc non Af Amer: 69 mL/min — ABNORMAL LOW (ref >90)
GFR, EST AFRICAN AMERICAN: 80 mL/min — AB (ref >90)
Glucose, Bld: 92 mg/dL (ref 70–99)
Potassium: 3.9 mEq/L (ref 3.7–5.3)
Sodium: 139 mEq/L (ref 137–147)

## 2014-11-09 LAB — PRO B NATRIURETIC PEPTIDE: Pro B Natriuretic peptide (BNP): 1449 pg/mL — ABNORMAL HIGH (ref 0–125)

## 2014-11-09 LAB — CBG MONITORING, ED: Glucose-Capillary: 119 mg/dL — ABNORMAL HIGH (ref 70–99)

## 2014-11-09 MED ORDER — ARIPIPRAZOLE 10 MG PO TABS
10.0000 mg | ORAL_TABLET | Freq: Every day | ORAL | Status: DC
  Administered 2014-11-10 – 2014-11-11 (×3): 10 mg via ORAL
  Filled 2014-11-09 (×3): qty 1

## 2014-11-09 MED ORDER — NYSTATIN 100000 UNIT/GM EX CREA
TOPICAL_CREAM | Freq: Two times a day (BID) | CUTANEOUS | Status: DC
  Administered 2014-11-10 – 2014-11-12 (×6): via TOPICAL
  Filled 2014-11-09 (×2): qty 15

## 2014-11-09 MED ORDER — ASPIRIN EC 81 MG PO TBEC
81.0000 mg | DELAYED_RELEASE_TABLET | Freq: Every day | ORAL | Status: DC
  Administered 2014-11-10 – 2014-11-11 (×3): 81 mg via ORAL
  Filled 2014-11-09 (×3): qty 1

## 2014-11-09 MED ORDER — DONEPEZIL HCL 23 MG PO TABS
23.0000 mg | ORAL_TABLET | Freq: Every day | ORAL | Status: DC
  Administered 2014-11-10 – 2014-11-11 (×3): 23 mg via ORAL
  Filled 2014-11-09 (×3): qty 1

## 2014-11-09 MED ORDER — DEXTROSE 5 % IV SOLN
500.0000 mg | INTRAVENOUS | Status: DC
  Administered 2014-11-09 – 2014-11-10 (×2): 500 mg via INTRAVENOUS
  Filled 2014-11-09 (×2): qty 500

## 2014-11-09 MED ORDER — BUSPIRONE HCL 5 MG PO TABS
5.0000 mg | ORAL_TABLET | Freq: Two times a day (BID) | ORAL | Status: DC
  Administered 2014-11-10 – 2014-11-12 (×6): 5 mg via ORAL
  Filled 2014-11-09 (×6): qty 1

## 2014-11-09 MED ORDER — PANTOPRAZOLE SODIUM 40 MG PO TBEC
40.0000 mg | DELAYED_RELEASE_TABLET | Freq: Every day | ORAL | Status: DC
  Administered 2014-11-09 – 2014-11-12 (×4): 40 mg via ORAL
  Filled 2014-11-09 (×6): qty 1

## 2014-11-09 MED ORDER — HEPARIN SODIUM (PORCINE) 5000 UNIT/ML IJ SOLN
5000.0000 [IU] | Freq: Three times a day (TID) | INTRAMUSCULAR | Status: DC
  Administered 2014-11-10 – 2014-11-12 (×7): 5000 [IU] via SUBCUTANEOUS
  Filled 2014-11-09 (×9): qty 1

## 2014-11-09 MED ORDER — LORAZEPAM 0.5 MG PO TABS
0.5000 mg | ORAL_TABLET | Freq: Every day | ORAL | Status: DC
  Administered 2014-11-10: 1 mg via ORAL
  Administered 2014-11-10 – 2014-11-11 (×2): 0.5 mg via ORAL
  Filled 2014-11-09: qty 2
  Filled 2014-11-09 (×2): qty 1

## 2014-11-09 MED ORDER — ESCITALOPRAM OXALATE 20 MG PO TABS
20.0000 mg | ORAL_TABLET | Freq: Every day | ORAL | Status: DC
  Administered 2014-11-09 – 2014-11-12 (×4): 20 mg via ORAL
  Filled 2014-11-09: qty 2
  Filled 2014-11-09 (×3): qty 1

## 2014-11-09 MED ORDER — HYDROCODONE-ACETAMINOPHEN 5-325 MG PO TABS
1.0000 | ORAL_TABLET | ORAL | Status: DC | PRN
  Administered 2014-11-10 – 2014-11-11 (×2): 1 via ORAL
  Filled 2014-11-09 (×2): qty 1

## 2014-11-09 MED ORDER — DIGOXIN 125 MCG PO TABS
0.1250 mg | ORAL_TABLET | Freq: Every day | ORAL | Status: DC
  Administered 2014-11-10 – 2014-11-12 (×4): 0.125 mg via ORAL
  Filled 2014-11-09 (×4): qty 1

## 2014-11-09 MED ORDER — FUROSEMIDE 10 MG/ML IJ SOLN
40.0000 mg | INTRAMUSCULAR | Status: AC
  Administered 2014-11-09: 40 mg via INTRAVENOUS
  Filled 2014-11-09: qty 4

## 2014-11-09 MED ORDER — SIMVASTATIN 40 MG PO TABS
40.0000 mg | ORAL_TABLET | Freq: Every day | ORAL | Status: DC
  Administered 2014-11-10 – 2014-11-11 (×2): 40 mg via ORAL
  Filled 2014-11-09 (×3): qty 1

## 2014-11-09 MED ORDER — TRIAMTERENE-HCTZ 37.5-25 MG PO TABS
0.5000 | ORAL_TABLET | Freq: Every day | ORAL | Status: DC
  Administered 2014-11-09 – 2014-11-12 (×3): 0.5 via ORAL
  Filled 2014-11-09 (×4): qty 0.5

## 2014-11-09 MED ORDER — SODIUM CHLORIDE 0.9 % IJ SOLN
3.0000 mL | Freq: Two times a day (BID) | INTRAMUSCULAR | Status: DC
  Administered 2014-11-10 (×2): 3 mL via INTRAVENOUS

## 2014-11-09 MED ORDER — SODIUM CHLORIDE 0.9 % IJ SOLN: 3.0000 mL | Freq: Two times a day (BID) | INTRAMUSCULAR | Status: DC

## 2014-11-09 MED ORDER — SODIUM CHLORIDE 0.9 % IV SOLN: 250.0000 mL | INTRAVENOUS | Status: DC | PRN

## 2014-11-09 MED ORDER — EZETIMIBE 10 MG PO TABS
10.0000 mg | ORAL_TABLET | Freq: Every day | ORAL | Status: DC
  Administered 2014-11-10 – 2014-11-11 (×3): 10 mg via ORAL
  Filled 2014-11-09 (×3): qty 1

## 2014-11-09 MED ORDER — SODIUM CHLORIDE 0.9 % IJ SOLN: 3.0000 mL | INTRAMUSCULAR | Status: DC | PRN

## 2014-11-09 MED ORDER — ACETAMINOPHEN 325 MG PO TABS
650.0000 mg | ORAL_TABLET | Freq: Three times a day (TID) | ORAL | Status: DC
  Administered 2014-11-10 – 2014-11-12 (×10): 650 mg via ORAL
  Filled 2014-11-09 (×12): qty 2

## 2014-11-09 MED ORDER — ACETAMINOPHEN 500 MG PO TABS: 1000.0000 mg | ORAL_TABLET | Freq: Two times a day (BID) | ORAL | Status: DC

## 2014-11-09 MED ORDER — ACETAMINOPHEN 325 MG PO TABS
650.0000 mg | ORAL_TABLET | Freq: Four times a day (QID) | ORAL | Status: DC | PRN
  Administered 2014-11-09: 650 mg via ORAL
  Filled 2014-11-09: qty 2

## 2014-11-09 MED ORDER — ACETAMINOPHEN ER 650 MG PO TBCR: 1300.0000 mg | EXTENDED_RELEASE_TABLET | Freq: Two times a day (BID) | ORAL | Status: DC

## 2014-11-09 NOTE — Telephone Encounter (Signed)
If she is too weak to get out of the house to be seen, he needs to call 911.  She could possibly be dehydrated or septic.  This is very important.

## 2014-11-09 NOTE — Progress Notes (Signed)
  CARE MANAGEMENT ED NOTE 11/09/2014  Patient:  Sheila BerryMULLENS,[name]   Account Number:  [id number]  Date Initiated:  11/09/2014  Documentation initiated by:  Radford PaxFERRERO,Charlaine Utsey  Subjective/Objective Assessment:   Patient presents to ED with increased weakness and cough     Subjective/Objective Assessment Detail:   Patient with pmhx of alzheimer's dementia, HTN, Afib, hyperlipidemia     Action/Plan:   Action/Plan Detail:   Anticipated DC Date:       Status Recommendation to Physician:   Result of Recommendation:    Other ED Services  Consult Working Plan    DC [employer]  Other    Choice offered to / List presented to:  C-3 Spouse          Status of service:  Completed, signed off  ED Comments:   ED Comments Detail:  EDCM spoke to patient and her husband Sheila Charles at bedside. Patient's husband reports patient is no longer active with [facility].  Patient has a Charity fundraiserN, PT and an aide come to the home.  Patient lives with her husband.  Patient's husband reports he has been taking care of the patient 24/7, without help.  "The family doesn't help me either."  Patent has a bedside commode, shower chair, and a rolator at home but" she can't use that." "I have to holdher by the hands and guide her."  per husband.  Patient's husband would like to get patient a wheelchair.  Patient is 5'2 or 5'3 204 pounds per husband.  Patient's husband reports he has a friend who is going to talk to his pastor about building a ramp in front of his house for free or, "I have a boy who said he can build me one."  Patient's husband reports Dr. Elinor Dodgeabhori is patient's pcp.  Last time patient has seen her pcp was in September 2015.  EDCM [place] patient [place], [employer] [facility].  Patient's husband reports, "They were nice people and all, but by the time they really got into helping her, they left." ([facility]  EDCM offered patinet's husband list of private  duty nursing agencies, however,  patient's husband reports they would not be able to afford it.  Patient's husband reports his neighbor is looking into his church to see if he can find someone to sit with and assist patient for lower cost.  No futher EDCM needs at this time.

## 2014-11-09 NOTE — Telephone Encounter (Signed)
Husband advised to call 911 and have wife taken to ER.  Husband agreed, but stated he will have to call his son first.  Husband was strongly encouraged to call 911 now as patient could be dehydrated or septic and needs to be evaluated.   Husband states that he would rather wait for his son or daughter-in-law.  Again, Husband was instructed to call 911 and then call son and daughter-in-law.  Husband agreed.

## 2014-11-09 NOTE — Telephone Encounter (Signed)
Received medical records via mail [facility]. Records forwarded to Dr. Rennis Goldenabori's CMA. JG//CMA

## 2014-11-09 NOTE — ED Notes (Addendum)
Per spouse, pt has alzhiemers and lives with spouse.  Pt has had increased cough and weakness since Sunday.  Pt is slow to follow instructions but does this at baseline.  Pt able to stand with assist to get out of car in triage.  No unilateral weakness noted.  No change in speech.  No fever.

## 2014-11-09 NOTE — Consult Note (Signed)
Primary cardiologist: Dr Sheila Charles Consulting cardiologist: Dr Sheila RichJonathan Ki Corbo MD  Clinical Summary Sheila Charles is a 73 y.o.female history of sinus bradycardia, paroxysmal afib, episodic hypotension, severe Alzheimers dementia, hyperlipidemia admitted with cough and weakness. The patient is minimally verbal, history is primarily from husband. Reports severe day history of worsening cough, she has not indicated any chest pain or palpitations. For her afib she has seen been managed on digoxin due to intermittent episodes low blood pressures, she has been on ASA only for stroke prevention. She has also had trouble with bradycardia, and thus AV nodal agents have been avoid. Currently she takes digoxin 0.125mg  only if heart rate >60, her husband states she has not needed over the last week. From pcp note Alzheimers is at a point where there has been discussions about hospice, she is currently still living at home.     Pro-BNP 1449, Cr 0.83, GFR 69, K 3.9, Hgb 15.2, Plt 112, trop neg CXR mild pulm edema EKG afib rate 110,  No Known Allergies  Medications Scheduled Medications:     Infusions:     PRN Medications:  acetaminophen   Past Medical History  Diagnosis Date  . Paroxysmal atrial fibrillation   . Vertigo   . Hyperlipidemia   . Hypertension   . Depression   . Memory loss   . Degenerative arthritis   . Closed head injury   . Alzheimer's disease 07/24/2014  . Obesity   . UTI (urinary tract infection)     HX OF UTI    Past Surgical History  Procedure Laterality Date  . Abdominal hysterectomy    . Replacement total knee Right   . Gallbladder resection    . Humerus fracture surgery Right   . Cholecystectomy      Family History  Problem Relation Age of Onset  . Stroke Mother   . Cancer Father   . Dementia Sister     Social History Sheila Charles reports that she has never smoked. She has never used smokeless tobacco. Sheila Charles reports that she does not  drink alcohol.  Review of Systems Unable to collect ROS, patient is nonverbal due to dementia     Physical Examination Blood pressure 102/69, pulse 109, temperature 98.1 F (36.7 C), temperature source Oral, resp. rate 19, SpO2 97 %.  Intake/Output Summary (Last 24 hours) at 11/09/14 1937 Last data filed at 11/09/14 1611  Gross per 24 hour  Intake      0 ml  Output     10 ml  Net    -10 ml    HEENT: sclera clear, throat clear  Cardiovascular: irreg, rate 115, no m/r/g, no JVD  Respiratory: faint crackles bilateral bses  GI: abdomen soft, NT, ND  MSK: no LE edema  Psych: unable to assess, nonverbal  Neuro: moves all 4 extremities, does not answer questions   Lab Results  Basic Metabolic Panel:  Recent Labs Lab 11/09/14 1501  NA 139  K 3.9  CL 98  CO2 26  GLUCOSE 92  BUN 14  CREATININE 0.83  CALCIUM 9.5    Liver Function Tests: No results for input(s): AST, ALT, ALKPHOS, BILITOT, PROT, ALBUMIN in the last 168 hours.  CBC:  Recent Labs Lab 11/09/14 1501  WBC 7.3  NEUTROABS 5.1  HGB 15.2*  HCT 46.6*  MCV 93.2  PLT 112*    Cardiac Enzymes: No results for input(s): CKTOTAL, CKMB, CKMBINDEX, TROPONINI in the last 168 hours.  BNP: Invalid input(s):  POCBNP     Impression/Recommendations  1. Afib with RVR - rates elevated to 110-120s, historically therapy has been limited due to troubles with both bradycardia and hypotension. She is on oral digoxin at home, but has not taken in over a week because rates have been <60 - conservative strategy of rate control overnight, will give oral digoxin tonight and follow rates. May take some time for oral digoxin to take effect, favor conservative strategy with rates 110s-120s with stable bp vs risk of bradycardia and hypotension.  - she has not been on anticoagulation, on ASA only. CHADS2Vasc score of 2, given her advanced dementia seems to be fairly limited benefit and high risk of bleeding due to mobility  problems. Continue ASA for now.   2. Acute heart failure - Evidence of mild volume overload, she has received lasix 40mg  IV x1, follow strict I/Os - f/u echo tomorrow, check TSH - heart failure therapies will be limited due to bradycardia, hypotension, and severe advanced dementia     Sheila RichJonathan Talin Charles, M.D.

## 2014-11-09 NOTE — ED Provider Notes (Signed)
CSN: [id number]     Arrival date & time 11/09/14  1336 History   None    Chief Complaint  Patient presents with  . Cough  . Weakness     (Consider location/radiation/quality/duration/timing/severity/associated sxs/prior Treatment) HPI Comments: The patient is a 73 year old female past medical history of Alzheimer's, paroxysmal A. Fib, hypertension presenting to emergency room chief complaint of cough and weakness for several days. Patient's husband reports productive cough for approximately 3 days. Denies fever, nausea, vomiting. Reports chronic diarrhea. Patient's husband also reports generalized weakness patient was unable to stand today. Baseline patient is not able to ambulate or stand without assistance. Cardiologist: Ladona Ridgel  Level 5 caveat due to history of dementia  The history is provided by the patient. No language interpreter was used.    Past Medical History  Diagnosis Date  . Paroxysmal atrial fibrillation   . Vertigo   . Hyperlipidemia   . Hypertension   . Depression   . Memory loss   . Degenerative arthritis   . Closed head injury   . Alzheimer's disease 07/24/2014  . Obesity   . UTI (urinary tract infection)     HX OF UTI   Past Surgical History  Procedure Laterality Date  . Abdominal hysterectomy    . Replacement total knee Right   . Gallbladder resection    . Humerus fracture surgery Right   . Cholecystectomy     Family History  Problem Relation Age of Onset  . Stroke Mother   . Cancer Father   . Dementia Sister    History  Substance Use Topics  . Smoking status: Never Smoker   . Smokeless tobacco: Never Used  . Alcohol Use: No   OB History    No data available     Review of Systems  Constitutional: Negative for fever and chills.  Respiratory: Positive for cough.   Gastrointestinal: Positive for diarrhea. Negative for vomiting and abdominal pain.  Neurological: Positive for weakness.      Allergies  Review of patient's allergies  indicates no known allergies.  Home Medications   Prior to Admission medications   Medication Sig Start Date End Date Taking? Authorizing Provider  Acetaminophen (TYLENOL ARTHRITIS PAIN PO) Take 2 tablets by mouth 2 (two) times daily.   Yes Historical Provider, MD  ARIPiprazole (ABILIFY) 10 MG tablet Take 10 mg by mouth at bedtime.  06/06/14  Yes Historical Provider, MD  aspirin EC 81 MG tablet Take 81 mg by mouth at bedtime.   Yes Historical Provider, MD  busPIRone (BUSPAR) 5 MG tablet Take 1 tablet (5 mg total) by mouth 3 (three) times daily. Patient taking differently: Take 5 mg by mouth 2 (two) times daily.  10/03/14  Yes Sheliah Hatch, MD  digoxin (LANOXIN) 0.125 MG tablet Take 0.125 mg by mouth daily.    Yes Historical Provider, MD  donepezil (ARICEPT) 23 MG TABS tablet Take 1 tablet (23 mg total) by mouth at bedtime. 07/24/14  Yes York Spaniel, MD  escitalopram (LEXAPRO) 20 MG tablet Take 20 mg by mouth daily. 07/24/14  Yes Historical Provider, MD  ezetimibe (ZETIA) 10 MG tablet Take 1 tablet (10 mg total) by mouth at bedtime. 10/18/14  Yes Sheliah Hatch, MD  HYDROcodone-acetaminophen (NORCO/VICODIN) 5-325 MG per tablet 1 Tablet every 4-6 hours as needed for pain. 10/02/14  Yes Bayard Beaver Saguier, PA-C  LORazepam (ATIVAN) 0.5 MG tablet Take 1-2 tablets (0.5-1 mg total) by mouth at bedtime. 10/03/14  Yes Helane Rima  Beverely Lowabori, MD  OVER THE COUNTER MEDICATION Take 1 tablet by mouth daily. Legatrin PM   Yes Historical Provider, MD  pantoprazole (PROTONIX) 40 MG tablet Take 40 mg by mouth daily. 05/18/14  Yes Historical Provider, MD  simvastatin (ZOCOR) 40 MG tablet TAKE 1 TABLET BY MOUTH EVERY DAY 11/08/14  Yes Sheliah HatchKatherine E Tabori, MD  triamterene-hydrochlorothiazide (MAXZIDE-25) 37.5-25 MG per tablet Take 0.5 tablets by mouth daily.   Yes Historical Provider, MD  vitamin E 400 UNIT capsule Take 800 Units by mouth daily.    Yes Historical Provider, MD   BP 188/127 mmHg  Pulse 104   Temp(Src) 97.7 F (36.5 C) (Oral)  Resp 20  SpO2 94% Physical Exam  Constitutional: She appears well-developed and well-nourished. No distress.  Deconditioned female  HENT:  Head: Normocephalic and atraumatic.  Neck: Neck supple.  Cardiovascular: An irregularly irregular rhythm present. Tachycardia present.   HR: 100-120's during exam.  Pulmonary/Chest: No respiratory distress. She has no wheezes. She has rhonchi.  Abdominal: Soft. She exhibits no distension. There is no tenderness. There is no rigidity, no rebound and no guarding.  Erythremic rash, with yellow drainage under abdominal panis and right inguinal region.  Genitourinary:  No sacral decubitus ulcers.  Neurological: She is alert. She is disoriented.  Oriented to   Skin: Skin is warm and dry. Rash noted.  Psychiatric: She has a normal mood and affect.  Nursing note and vitals reviewed.   ED Course  Procedures (including critical care time) Labs Review Results for orders placed or performed during the hospital encounter of 11/09/14  Urinalysis, Routine w reflex microscopic  Result Value Ref Range   Color, Urine YELLOW YELLOW   APPearance CLEAR CLEAR   Specific Gravity, Urine 1.019 1.005 - 1.030   pH 7.0 5.0 - 8.0   Glucose, UA NEGATIVE NEGATIVE mg/dL   Hgb urine dipstick NEGATIVE NEGATIVE   Bilirubin Urine SMALL (A) NEGATIVE   Ketones, ur NEGATIVE NEGATIVE mg/dL   Protein, ur NEGATIVE NEGATIVE mg/dL   Urobilinogen, UA 2.0 (H) 0.0 - 1.0 mg/dL   Nitrite NEGATIVE NEGATIVE   Leukocytes, UA NEGATIVE NEGATIVE  CBC with Differential  Result Value Ref Range   WBC 7.3 4.0 - 10.5 K/uL   RBC 5.00 3.87 - 5.11 MIL/uL   Hemoglobin 15.2 (H) 12.0 - 15.0 g/dL   HCT 16.146.6 (H) 09.636.0 - 04.546.0 %   MCV 93.2 78.0 - 100.0 fL   MCH 30.4 26.0 - 34.0 pg   MCHC 32.6 30.0 - 36.0 g/dL   RDW 40.913.5 81.111.5 - 91.415.5 %   Platelets 112 (L) 150 - 400 K/uL   Neutrophils Relative % 70 43 - 77 %   Neutro Abs 5.1 1.7 - 7.7 K/uL   Lymphocytes Relative  16 12 - 46 %   Lymphs Abs 1.2 0.7 - 4.0 K/uL   Monocytes Relative 13 (H) 3 - 12 %   Monocytes Absolute 0.9 0.1 - 1.0 K/uL   Eosinophils Relative 1 0 - 5 %   Eosinophils Absolute 0.1 0.0 - 0.7 K/uL   Basophils Relative 0 0 - 1 %   Basophils Absolute 0.0 0.0 - 0.1 K/uL  Basic metabolic panel  Result Value Ref Range   Sodium 139 137 - 147 mEq/L   Potassium 3.9 3.7 - 5.3 mEq/L   Chloride 98 96 - 112 mEq/L   CO2 26 19 - 32 mEq/L   Glucose, Bld 92 70 - 99 mg/dL   BUN 14 6 - 23 mg/dL  Creatinine, Ser 0.83 0.50 - 1.10 mg/dL   Calcium 9.5 8.4 - 78.210.5 mg/dL   GFR calc non Af Amer 69 (L) >90 mL/min   GFR calc Af Amer 80 (L) >90 mL/min   Anion gap 15 5 - 15  Pro b natriuretic peptide (BNP)  Result Value Ref Range   Pro B Natriuretic peptide (BNP) 1449.0 (H) 0 - 125 pg/mL  CBG monitoring, ED  Result Value Ref Range   Glucose-Capillary 119 (H) 70 - 99 mg/dL  I-stat troponin, ED  Result Value Ref Range   Troponin i, poc 0.01 0.00 - 0.08 ng/mL   Comment 3           Imaging Review Dg Chest 2 View  11/09/2014   CLINICAL DATA:  Cough and increase weakness over the last 4 days  EXAM: CHEST  2 VIEW  COMPARISON:  09/13/2014  FINDINGS: Cardiac shadow remains mildly enlarged. The lungs are well aerated with very mild vascular congestion. No focal infiltrate is seen. Minimal right basilar atelectasis is noted.  IMPRESSION: Mild vascular congestion and right basilar atelectasis.   Electronically Signed   By: Alcide CleverMark  Lukens M.D.   On: 11/09/2014 16:29     EKG Interpretation   Date/Time:  Thursday November 09 2014 14:07:31 EST Ventricular Rate:  110 PR Interval:    QRS Duration: 89 QT Interval:  337 QTC Calculation: 456 R Axis:   18 Text Interpretation:  Atrial fibrillation Borderline repolarization  abnormality ST depression V1-V3, suggest recording posterior leads Atrial  fibrillation Abnormal ekg Confirmed by Gerhard MunchLOCKWOOD, ROBERT  MD [phone]) on  11/09/2014 6:37:05 PM      MDM   Final  diagnoses:  CHF Cough hypertension A-fib Rash   Patient presents with productive cough, rectal temperature 99.5, auscultation reveals bilateral rhonchi. Concerning for pneumonia. X-ray shows mild vascular congestion and right basilar atelectasis, BNP ordered. Dr. Jeraldine LootsLockwood also evaluated that patient. Pt is in a-fib, history of af-b, will hold on EKG and Troponin at this time. Discussed pt history and condition with hospitalist who advises, EKG, troponin and cardiology consulted. EKG similar to previous, negative troponin. Discussed patient history, condition with Dr. Wyline MoodBranch( cardiology) who agrees to consult on the patient. Discussed patient history, condition with Dr. Julian ReilGardner who agrees to admit the patient.  Mellody DrownLauren Prayan Ulin, PA-C 11/10/14 [id number]  Gerhard Munchobert Lockwood, MD 11/13/14 [phone]

## 2014-11-09 NOTE — H&P (Signed)
[facility] History and Physical  Sheila BerryLinda Charles ZOX:[id number] DOB: 12/19/1941 DOA: 11/09/2014  Referring physician: EDP PCP: Neena RhymesKatherine Tabori, MD   Chief Complaint: Cough, SOB   HPI: Sheila BerryLinda Charles is a 73 y.o. female with advanced alzheimers dementia, history of PAF 15 years ago, husband believes she has been in sinus rhythm since a cardioversion at that time.  She presents to the ED with cough and generalized weakness over the past 3 days per her husband.  She was also complaining of a sore throat and intermittently being cold and hot he states.  She hasnt been running a fever when he has measured her temperature at home.  Of note he had a similar respiratory illness with cough, fever, as well over the past several days which has improved but he still has some symptoms.  Review of Systems: Unable to perform due to dementia.  Past Medical History  Diagnosis Date  . Paroxysmal atrial fibrillation   . Vertigo   . Hyperlipidemia   . Hypertension   . Depression   . Memory loss   . Degenerative arthritis   . Closed head injury   . Alzheimer's disease 07/24/2014  . Obesity   . UTI (urinary tract infection)     HX OF UTI   Past Surgical History  Procedure Laterality Date  . Abdominal hysterectomy    . Replacement total knee Right   . Gallbladder resection    . Humerus fracture surgery Right   . Cholecystectomy     Social History:  reports that she has never smoked. She has never used smokeless tobacco. She reports that she does not drink alcohol or use illicit drugs.  No Known Allergies  Family History  Problem Relation Age of Onset  . Stroke Mother   . Cancer Father   . Dementia Sister      Prior to Admission medications   Medication Sig Start Date End Date Taking? Authorizing Provider  Acetaminophen (TYLENOL ARTHRITIS PAIN PO) Take 2 tablets by mouth 2 (two) times daily.   Yes Historical Provider, MD  ARIPiprazole (ABILIFY) 10 MG tablet Take 10 mg by mouth at  bedtime.  06/06/14  Yes Historical Provider, MD  aspirin EC 81 MG tablet Take 81 mg by mouth at bedtime.   Yes Historical Provider, MD  busPIRone (BUSPAR) 5 MG tablet Take 1 tablet (5 mg total) by mouth 3 (three) times daily. Patient taking differently: Take 5 mg by mouth 2 (two) times daily.  10/03/14  Yes Sheliah HatchKatherine E Tabori, MD  digoxin (LANOXIN) 0.125 MG tablet Take 0.125 mg by mouth daily.    Yes Historical Provider, MD  donepezil (ARICEPT) 23 MG TABS tablet Take 1 tablet (23 mg total) by mouth at bedtime. 07/24/14  Yes York Spanielharles K Willis, MD  escitalopram (LEXAPRO) 20 MG tablet Take 20 mg by mouth daily. 07/24/14  Yes Historical Provider, MD  ezetimibe (ZETIA) 10 MG tablet Take 1 tablet (10 mg total) by mouth at bedtime. 10/18/14  Yes Sheliah HatchKatherine E Tabori, MD  HYDROcodone-acetaminophen (NORCO/VICODIN) 5-325 MG per tablet 1 Tablet every 4-6 hours as needed for pain. 10/02/14  Yes Bayard BeaverEdward M Saguier, PA-C  LORazepam (ATIVAN) 0.5 MG tablet Take 1-2 tablets (0.5-1 mg total) by mouth at bedtime. 10/03/14  Yes Sheliah HatchKatherine E Tabori, MD  OVER THE COUNTER MEDICATION Take 1 tablet by mouth daily. Legatrin PM   Yes Historical Provider, MD  pantoprazole (PROTONIX) 40 MG tablet Take 40 mg by mouth daily. 05/18/14  Yes Historical Provider, MD  simvastatin (ZOCOR) 40 MG tablet TAKE 1 TABLET BY MOUTH EVERY DAY 11/08/14  Yes Sheliah Hatch, MD  triamterene-hydrochlorothiazide (MAXZIDE-25) 37.5-25 MG per tablet Take 0.5 tablets by mouth daily.   Yes Historical Provider, MD  vitamin E 400 UNIT capsule Take 800 Units by mouth daily.    Yes Historical Provider, MD   Physical Exam: Filed Vitals:   11/09/14 2020  BP: 102/76  Pulse: 119  Temp:   Resp: 24    BP 102/76 mmHg  Pulse 119  Temp(Src) 98.1 F (36.7 C) (Oral)  Resp 24  SpO2 96%  General Appearance:    Alert, not oriented, no distress, appears stated age  Head:    Normocephalic, atraumatic  Eyes:    PERRL, EOMI, sclera non-icteric        Nose:   Nares  without drainage or epistaxis. Mucosa, turbinates normal  Throat:   Moist mucous membranes. Oropharynx without erythema or exudate.  Neck:   Supple. No carotid bruits.  No thyromegaly.  No lymphadenopathy.   Back:     No CVA tenderness, no spinal tenderness  Lungs:     B rhonchi and rales  Chest wall:    No tenderness to palpitation  Heart:    Irregularly irregular, tachycardic  Abdomen:     Soft, non-tender, nondistended, normal bowel sounds, no organomegaly  Genitalia:    deferred  Rectal:    deferred  Extremities:   No clubbing, cyanosis or edema.  Pulses:   2+ and symmetric all extremities  Skin:   Erythematous rash under abdominal panus skin folds and right inguinal region.  Lymph nodes:   Cervical, supraclavicular, and axillary nodes normal  Neurologic:   CNII-XII intact. Generalized, non-focal weakness, sensation and reflexes throughout    Labs on Admission:  Basic Metabolic Panel:  Recent Labs Lab 11/09/14 1501  NA 139  K 3.9  CL 98  CO2 26  GLUCOSE 92  BUN 14  CREATININE 0.83  CALCIUM 9.5   Liver Function Tests: No results for input(s): AST, ALT, ALKPHOS, BILITOT, PROT, ALBUMIN in the last 168 hours. No results for input(s): LIPASE, AMYLASE in the last 168 hours. No results for input(s): AMMONIA in the last 168 hours. CBC:  Recent Labs Lab 11/09/14 1501  WBC 7.3  NEUTROABS 5.1  HGB 15.2*  HCT 46.6*  MCV 93.2  PLT 112*   Cardiac Enzymes: No results for input(s): CKTOTAL, CKMB, CKMBINDEX, TROPONINI in the last 168 hours.  BNP (last 3 results)  Recent Labs  11/09/14 1501  PROBNP 1449.0*   CBG:  Recent Labs Lab 11/09/14 1418  GLUCAP 119*    Radiological Exams on Admission: Dg Chest 2 View  11/09/2014   CLINICAL DATA:  Cough and increase weakness over the last 4 days  EXAM: CHEST  2 VIEW  COMPARISON:  09/13/2014  FINDINGS: Cardiac shadow remains mildly enlarged. The lungs are well aerated with very mild vascular congestion. No focal infiltrate  is seen. Minimal right basilar atelectasis is noted.  IMPRESSION: Mild vascular congestion and right basilar atelectasis.   Electronically Signed   By: [name]   On: 11/09/2014 16:29    EKG: Independently reviewed.  Lateral ST depressions that were present in September although new at that time.  A.Fib RVR which was not present in September (was NSR at that time).  Assessment/Plan Principal Problem:   Acute CHF Active Problems:   Paroxysmal atrial fibrillation   Alzheimer's disease   HTN (hypertension)   EKG  abnormality   Fungal dermatitis   Acute bronchitis   Atrial fibrillation with RVR   1. Acute bronchitis - especially given history of similar illness at same time period in husband 1. Azithromycin 2. Monitor for fevers, CBC repeat in AM, look for other signs that this could progress to PNA, if PNA found then step up ABx to CAP coverage (add rocephin). 3. Cultures pending 4. Adult wheeze protocol 2. Acute CHF and A.Fib RVR 1. Suspect the mild CHF is exacerbated by patients A.Fib RVR 2. Lasix 40mg  in ED 3. Strict intake and output 4. Cards consulted 5. Rate control with PO digoxin per cards consult. 6. 2d echo ordered 3. Fungal dermatitis 1. Nystatin ordered 2. May worsen given ABx therapy above and require treatment with oral fluconazole 4. Advanced alzheimer's dementia 1. Palliative care consult ordered, husband wants her full code for now but notes that he had an argument with his son this morning regarding this and the fact that she has been recommended for hospice already. 5. HTN - continue home meds    Code Status: Full code for now (see #4 above)  Family Communication: Husband at bedside Disposition Plan: Admit to inpatient   Time spent: 70 min  GARDNER, JARED M. [facility] Pager [phone]  If 7AM-7PM, please contact the day team taking care of the patient [email or web address] Password [email or web address] 11/09/2014, 8:31 PM

## 2014-11-09 NOTE — Telephone Encounter (Signed)
Patient husband says that he tried taking patient to an UC but patient could barely get out of the house. He says that she is weak. I explained to him that Dr. Beverely Lowabori would need to see patient before prescribing any meds. Patient wants to know what he should do since he cannot bring her in to us or an UC.

## 2014-11-10 DIAGNOSIS — M2550 Pain in unspecified joint: Secondary | ICD-10-CM

## 2014-11-10 DIAGNOSIS — J208 Acute bronchitis due to other specified organisms: Secondary | ICD-10-CM

## 2014-11-10 DIAGNOSIS — Z515 Encounter for palliative care: Secondary | ICD-10-CM

## 2014-11-10 DIAGNOSIS — I4891 Unspecified atrial fibrillation: Secondary | ICD-10-CM

## 2014-11-10 LAB — BASIC METABOLIC PANEL
Anion gap: 15 (ref 5–15)
BUN: 15 mg/dL (ref 6–23)
CO2: 30 meq/L (ref 19–32)
Calcium: 9.4 mg/dL (ref 8.4–10.5)
Chloride: 95 mEq/L — ABNORMAL LOW (ref 96–112)
Creatinine, Ser: 0.94 mg/dL (ref 0.50–1.10)
GFR calc Af Amer: 69 mL/min — ABNORMAL LOW (ref >90)
GFR calc non Af Amer: 59 mL/min — ABNORMAL LOW (ref >90)
GLUCOSE: 90 mg/dL (ref 70–99)
Potassium: 3.3 mEq/L — ABNORMAL LOW (ref 3.7–5.3)
Sodium: 140 mEq/L (ref 137–147)

## 2014-11-10 LAB — CBC
HCT: 45.5 % (ref 36.0–46.0)
HEMOGLOBIN: 14.7 g/dL (ref 12.0–15.0)
MCH: 30.4 pg (ref 26.0–34.0)
MCHC: 32.3 g/dL (ref 30.0–36.0)
MCV: 94.2 fL (ref 78.0–100.0)
Platelets: 128 10*3/uL — ABNORMAL LOW (ref 150–400)
RBC: 4.83 MIL/uL (ref 3.87–5.11)
RDW: 13.7 % (ref 11.5–15.5)
WBC: 7.3 10*3/uL (ref 4.0–10.5)

## 2014-11-10 MED ORDER — GUAIFENESIN-DM 100-10 MG/5ML PO SYRP
5.0000 mL | ORAL_SOLUTION | ORAL | Status: DC | PRN
  Administered 2014-11-10: 5 mL via ORAL
  Filled 2014-11-10: qty 10

## 2014-11-10 MED ORDER — ENSURE COMPLETE PO LIQD: 237.0000 mL | Freq: Two times a day (BID) | ORAL | Status: DC

## 2014-11-10 NOTE — Plan of Care (Signed)
Problem: Phase I Progression Outcomes Goal: Voiding-avoid urinary catheter unless indicated Outcome: Progressing     

## 2014-11-10 NOTE — Plan of Care (Signed)
Problem: Phase I Progression Outcomes Goal: Dyspnea controlled at rest (HF) Outcome: Progressing Goal: Pain controlled with appropriate interventions Outcome: Completed/Met Date Met:  11/10/14

## 2014-11-10 NOTE — Progress Notes (Addendum)
SUBJECTIVE:  No complaints.  Incontinent.  Wants to go home.   OBJECTIVE:   Vitals:   Filed Vitals:   11/09/14 2125 11/09/14 2132 11/10/14 0637 11/10/14 0900  BP: 159/115 87/59 118/57 94/58  Pulse: 71  83 113  Temp: 98.3 F (36.8 C)  97.6 F (36.4 C)   TempSrc: Oral  Oral   Resp: 24  20   Height: 5\' 2"  (1.575 m)     Weight: 197 lb 8.5 oz (89.6 kg)     SpO2: 98%  94%    I&O's:   Intake/Output Summary (Last 24 hours) at 11/10/14 1201 Last data filed at 11/10/14 1018  Gross per 24 hour  Intake     30 ml  Output    185 ml  Net   -155 ml        PHYSICAL EXAM General: Well developed, well nourished, in no acute distress Head:   Normal cephalic and atramatic  Lungs: Clear bilaterally to auscultation. Heart:  irregular S1 S2  No JVD.   Abdomen: abdomen soft and non-tender Msk:  Back normal,  Normal strength and tone for age. Extremities:  No edema.   Neuro: Alert  Psych:  flat affect, responds appropriately Skin: No rash   LABS: Basic Metabolic Panel:  Recent Labs  96/03/5410/12/15 1501 11/10/14 0432  NA 139 140  K 3.9 3.3*  CL 98 95*  CO2 26 30  GLUCOSE 92 90  BUN 14 15  CREATININE 0.83 0.94  CALCIUM 9.5 9.4   Liver Function Tests: No results for input(s): AST, ALT, ALKPHOS, BILITOT, PROT, ALBUMIN in the last 72 hours. No results for input(s): LIPASE, AMYLASE in the last 72 hours. CBC:  Recent Labs  11/09/14 1501 11/10/14 0432  WBC 7.3 7.3  NEUTROABS 5.1  --   HGB 15.2* 14.7  HCT 46.6* 45.5  MCV 93.2 94.2  PLT 112* 128*   Cardiac Enzymes: No results for input(s): CKTOTAL, CKMB, CKMBINDEX, TROPONINI in the last 72 hours. BNP: Invalid input(s): POCBNP D-Dimer: No results for input(s): DDIMER in the last 72 hours. Hemoglobin A1C: No results for input(s): HGBA1C in the last 72 hours. Fasting Lipid Panel: No results for input(s): CHOL, HDL, LDLCALC, TRIG, CHOLHDL, LDLDIRECT in the last 72 hours. Thyroid Function Tests: No results for input(s):  TSH, T4TOTAL, T3FREE, THYROIDAB in the last 72 hours.  Invalid input(s): FREET3 Anemia Panel: No results for input(s): VITAMINB12, FOLATE, FERRITIN, TIBC, IRON, RETICCTPCT in the last 72 hours. Coag Panel:   No results found for: INR, PROTIME  RADIOLOGY: Dg Chest 2 View  11/09/2014   CLINICAL DATA:  Cough and increase weakness over the last 4 days  EXAM: CHEST  2 VIEW  COMPARISON:  09/13/2014  FINDINGS: Cardiac shadow remains mildly enlarged. The lungs are well aerated with very mild vascular congestion. No focal infiltrate is seen. Minimal right basilar atelectasis is noted.  IMPRESSION: Mild vascular congestion and right basilar atelectasis.   Electronically Signed   By: Sheila CleverMark  Charles M.D.   On: 11/09/2014 16:29      ASSESSMENT: Sheila Charles/PLAN:    AFib:  HR much better now.  Paroxysmal AFib so may end up back in NSR. Not a candidate for anticoagulation.  May need higher dose of diuretic at honme to help with fluid overload which was present on arrival.    heart failure-Likely Diastolic: Doing much better after IV lasix yesterday.  [place] echo result.  Appears euvolemic; lying flat on room air.  Cannot measure output  due to incontinence.  Sheila Charles,[name], MD  11/10/2014  12:01 PM

## 2014-11-10 NOTE — Progress Notes (Signed)
INITIAL NUTRITION ASSESSMENT  DOCUMENTATION CODES Per approved criteria  -Severe malnutrition in the context of chronic illness  Pt meets criteria for severe MALNUTRITION in the context of chronic disease as evidenced by 23% body weight loss in one year, PO intake < 75% for likely > one month.   INTERVENTION: -Recommend Ensure Complete po BID, each supplement provides 350 kcal and 13 grams of protein -RD to follow   NUTRITION DIAGNOSIS: Inadequate oral intake related to dementia as evidenced by PO intake <25%, hx of weight loss.   Goal: Pt to meet >/= 90% of their estimated nutrition needs    Monitor:  Total protein/energy intake, labs, weights, supplement acceptance  Reason for Assessment: MST  73 y.o. female  Admitting Dx: Acute CHF  ASSESSMENT: Sheila BerryLinda Charles is a 73 y.o. female with advanced alzheimers dementia, history of PAF 15 years ago, husband believes she has been in sinus rhythm since a cardioversion at that time.   -Pt and pt's family meeting with MD during attempt for RD assessment. Information gathered from physicians and RN documentation -RN screen indicates pt with 60 lb weight loss in one year, likely r/t to progressive decline of Alzheimer's disease (23% body weight loss, severe for time frame). Follows soft diet at home -PO intake currently minimal, 25% meal completion documented. Will order Ensure for nutrient replenishment -Pt with MSAD on buttock   Height: Ht Readings from Last 1 Encounters:  11/09/14 5\' 2"  (1.575 m)    Weight: Wt Readings from Last 1 Encounters:  11/09/14 197 lb 8.5 oz (89.6 kg)    Ideal Body Weight: 110 lb  % Ideal Body Weight: 179%  Wt Readings from Last 10 Encounters:  11/09/14 197 lb 8.5 oz (89.6 kg)  09/14/14 211 lb 11.2 oz (96.026 kg)  09/13/14 210 lb 12.8 oz (95.618 kg)  08/23/14 216 lb (97.977 kg)  07/24/14 221 lb (100.245 kg)  05/28/12 256 lb (116.121 kg)  04/13/12 259 lb 12.8 oz (117.845 kg)    Usual Body  Weight: 250 per previous medical reocrds  % Usual Body Weight: 77%  BMI:  Body mass index is 36.12 kg/(m^2). Obesity  Estimated Nutritional Needs: Kcal: 1600-1800 Protein:85-95 gram Fluid: >/= 1600 ml daily  Skin: RLE and LLE nonpitting edema, MSAD  Diet Order: Diet Heart  EDUCATION NEEDS: -No education needs identified at this time   Intake/Output Summary (Last 24 hours) at 11/10/14 1637 Last data filed at 11/10/14 1520  Gross per 24 hour  Intake     60 ml  Output    175 ml  Net   -115 ml    Last BM: 11/12   Labs:   Recent Labs Lab 11/09/14 1501 11/10/14 0432  NA 139 140  K 3.9 3.3*  CL 98 95*  CO2 26 30  BUN 14 15  CREATININE 0.83 0.94  CALCIUM 9.5 9.4  GLUCOSE 92 90    CBG (last 3)   Recent Labs  11/09/14 1418  GLUCAP 119*    Scheduled Meds: . acetaminophen  650 mg Oral TID PC & HS  . ARIPiprazole  10 mg Oral QHS  . aspirin EC  81 mg Oral QHS  . azithromycin  500 mg Intravenous Q24H  . busPIRone  5 mg Oral BID  . digoxin  0.125 mg Oral Daily  . donepezil  23 mg Oral QHS  . escitalopram  20 mg Oral Daily  . ezetimibe  10 mg Oral QHS  . heparin  5,000 Units Subcutaneous 3  times per day  . LORazepam  0.5-1 mg Oral QHS  . nystatin cream   Topical BID  . pantoprazole  40 mg Oral Daily  . simvastatin  40 mg Oral q1800  . sodium chloride  3 mL Intravenous Q12H  . sodium chloride  3 mL Intravenous Q12H  . triamterene-hydrochlorothiazide  0.5 tablet Oral Daily    Continuous Infusions:   Past Medical History  Diagnosis Date  . Paroxysmal atrial fibrillation   . Vertigo   . Hyperlipidemia   . Hypertension   . Depression   . Memory loss   . Degenerative arthritis   . Closed head injury   . Alzheimer's disease 07/24/2014  . Obesity   . UTI (urinary tract infection)     HX OF UTI    Past Surgical History  Procedure Laterality Date  . Abdominal hysterectomy    . Replacement total knee Right   . Gallbladder resection    . Humerus  fracture surgery Right   . Cholecystectomy      Lloyd HugerSarah F Milan Perkins MS RD LDN Clinical Dietitian Pager:[phone]

## 2014-11-10 NOTE — Consult Note (Signed)
Patient UJ:[id number]:Sheila Charles      DOB: 01/14/1941      YNW:[id number]     [facility]    Consult Requested by: Dr Julian ReilGardner     PCP: Neena RhymesKatherine Tabori, MD Reason for Consultation: Hospice Consult????    Phone Number:[phone]  Assessment/Recommendations: 73 yo female with dementia, afib, HTN who presentd with cough and generalized weakness. Admitted with acute bronchitis, chf exacerbation  1.  Code Status: Full, I did not discuss today. I think given documented contentious nature of documented conversation on admission we should hold off for the time being on pressing this issue.   2. GOC: Husband Sheila Charles has been caring for her at home. She has had significant decline over past few months.  He believes she has lost 60lbs in the past year (looks more like 30-35 per epic), now is basically functionally bedbound and can only get up with significant assistance. Unable to feed herself. She is incontinent of both urine/stool and can't have any reasoned conversation.  Her FAST score is clearly >7 and with her weight loss increasing hospitalizations, I think she would be eligible for home hospice care. Husband is adamant that he does not want her in nursing home, no matter how bad situation gets.  His goal will not change from home anytime soon I suspect.  Aune's PCP had tried to arrange hosp[place] care through [facility] [place], but husband tells me they felt she did not meet criteria.  He is open to trying other hospice agencies in community as with above and her decline (as he even notes over past 2 months) I think she is a candidate for home hospice care.  He has extreme difficulty getting her to Dr's appointments and admits he at least needs some help at home.  They are unable to afford paid caregivers at home.  We talked about natural trajectory of illness with dementia and how she will likely continue to decline and be more prone to infections.  He has  heard and researched this before.  He was concerned about hospice stopping some of her medicines, and I agreed that we should consider doing this and that in my experience hospice does not stop medicines to shorten life, they stop medicines that aren't helpful in giving comfort/meaning to her life.  He is interested in seeing if they can help at home.     3. Symptom Management:   1. Arthritic Pain: agree with scheduled tylenol. PRN hydrocodone 2. Agitation/confusion: On home buspar, abilify, lorazepam  4. Psychosocial/Spiritual: Lives at home with her husband Sheila Charles. Has a son [place] 10-15 miles from [place].  [place]  Husband has worked as Education officer, environmentalpastor and feels they are both in good place spiritually. Husband has health problems of his own and has prayed that he stays healthy enough to care for her before she dies.   Brief HPI: 73 yo female with PMHx of Alzheimers, afib, HTN, sinus bradycardia. Recently admitted in Sept for Left arm swelling/pain which was felt 2/2 dependent edema.  Presented with weakness (husband could not get her in car even with his considerable assistance), cough for past 3 days.  Husband reports decline over past year and this has been more rapid over past 2-3 months. She is for all intensive purposes bedbound. No longer able to feed herself.  Wt loss of ~60lb in past year.  Continued cognitive decline.  Incontinent of stool/urine.  Patient  is unable to provide any history.      ROS: Unable to obtain in setting of severe dementia.     PMH:  Past Medical History  Diagnosis Date  . Paroxysmal atrial fibrillation   . Vertigo   . Hyperlipidemia   . Hypertension   . Depression   . Memory loss   . Degenerative arthritis   . Closed head injury   . Alzheimer's disease 07/24/2014  . Obesity   . UTI (urinary tract infection)     HX OF UTI     PSH: Past Surgical History  Procedure Laterality Date  . Abdominal hysterectomy    .  Replacement total knee Right   . Gallbladder resection    . Humerus fracture surgery Right   . Cholecystectomy     I have reviewed the FH and SH and  If appropriate update it with new information. No Known Allergies Scheduled Meds: . acetaminophen  650 mg Oral TID PC & HS  . ARIPiprazole  10 mg Oral QHS  . aspirin EC  81 mg Oral QHS  . azithromycin  500 mg Intravenous Q24H  . busPIRone  5 mg Oral BID  . digoxin  0.125 mg Oral Daily  . donepezil  23 mg Oral QHS  . escitalopram  20 mg Oral Daily  . ezetimibe  10 mg Oral QHS  . heparin  5,000 Units Subcutaneous 3 times per day  . LORazepam  0.5-1 mg Oral QHS  . nystatin cream   Topical BID  . pantoprazole  40 mg Oral Daily  . simvastatin  40 mg Oral q1800  . sodium chloride  3 mL Intravenous Q12H  . sodium chloride  3 mL Intravenous Q12H  . triamterene-hydrochlorothiazide  0.5 tablet Oral Daily   Continuous Infusions:  PRN Meds:.sodium chloride, acetaminophen, HYDROcodone-acetaminophen, sodium chloride    BP 94/58 mmHg  Pulse 113  Temp(Src) 97.6 F (36.4 C) (Oral)  Resp 20  Ht 5\' 2"  (1.575 m)  Wt 89.6 kg (197 lb 8.5 oz)  BMI 36.12 kg/m2  SpO2 94%   PPS: 40   Intake/Output Summary (Last 24 hours) at 11/10/14 1406 Last data filed at 11/10/14 1018  Gross per 24 hour  Intake     30 ml  Output    185 ml  Net   -155 ml    Physical Exam:  General: Alert, NAD HEENT: sclera anciteric Neck: supple Chest:   CTAB CVS: RRR Abd: obese, soft, +BS, ND Neuro: Oriented to self only Ext: edematous Skin: warm/dry  Labs: CBC    Component Value Date/Time   WBC 7.3 11/10/2014 0432   RBC 4.83 11/10/2014 0432   HGB 14.7 11/10/2014 0432   HCT 45.5 11/10/2014 0432   PLT 128* 11/10/2014 0432   MCV 94.2 11/10/2014 0432   MCH 30.4 11/10/2014 0432   MCHC 32.3 11/10/2014 0432   RDW 13.7 11/10/2014 0432   LYMPHSABS 1.2 11/09/2014 1501   MONOABS 0.9 11/09/2014 1501   EOSABS 0.1 11/09/2014 1501   BASOSABS 0.0 11/09/2014 1501     BMET    Component Value Date/Time   NA 140 11/10/2014 0432   K 3.3* 11/10/2014 0432   CL 95* 11/10/2014 0432   CO2 30 11/10/2014 0432   GLUCOSE 90 11/10/2014 0432   BUN 15 11/10/2014 0432   CREATININE 0.94 11/10/2014 0432   CALCIUM 9.4 11/10/2014 0432   GFRNONAA 59* 11/10/2014 0432   GFRAA 69* 11/10/2014 0432    CMP  Component Value Date/Time   NA 140 11/10/2014 0432   K 3.3* 11/10/2014 0432   CL 95* 11/10/2014 0432   CO2 30 11/10/2014 0432   GLUCOSE 90 11/10/2014 0432   BUN 15 11/10/2014 0432   CREATININE 0.94 11/10/2014 0432   CALCIUM 9.4 11/10/2014 0432   PROT 7.2 09/13/2014 1535   ALBUMIN 3.3* 09/13/2014 1535   AST 17 09/13/2014 1535   ALT 9 09/13/2014 1535   ALKPHOS 68 09/13/2014 1535   BILITOT 0.7 09/13/2014 1535   GFRNONAA 59* 11/10/2014 0432   GFRAA 69* 11/10/2014 0432    11/12 CXR IMPRESSION: Mild vascular congestion and right basilar atelectasis.   Total Time: 90 minutes  Greater than 50%  of this time was spent counseling and coordinating care related to the above assessment and plan.   Orvis Brill D.O. Palliative Medicine Team at [facility]  Pager: [phone] Team Phone: [phone]

## 2014-11-10 NOTE — Progress Notes (Signed)
  Echocardiogram 2D Echocardiogram has been performed.  Leta JunglingCooper, Nyella Eckels M 11/10/2014, 8:43 AM

## 2014-11-10 NOTE — Progress Notes (Signed)
[facility] PROGRESS NOTE  Filed Weights   11/09/14 2125  Weight: 89.6 kg (197 lb 8.5 oz)        Intake/Output Summary (Last 24 hours) at 11/10/14 1223 Last data filed at 11/10/14 1018  Gross per 24 hour  Intake     30 ml  Output    185 ml  Net   -155 ml     Assessment/Plan: Acute CHF: -  Started on IV diuretics, consulted cardiology. - monitor Strict I and O's. Daily weights. - echo pending, monitor electrolytes replete as needed. - b-met in am.  Acute bronchitis - on azithromycin. - afebrile.  Atrial fibrillation with RVR - STarted on digoxin which she has not taken at home for several weeks.Now back in SR. - CHADS2Vasc score of 2, on ASA. Not candidate for antocoagulant.  Fungal dermatitis: - Nystatin ordered  Advanced alzheimer's dementia: - Palliative care consult ordered, husband wants her full code.  Code Status: FULL Family Communication: NONE  Disposition Plan: INPATIENT   Consultants:  cardiology  Procedures: ECHO: pending  Antibiotics:  azithro  HPI/Subjective: No complains  Objective: Filed Vitals:   11/09/14 2125 11/09/14 2132 11/10/14 0637 11/10/14 0900  BP: 159/115 87/59 118/57 94/58  Pulse: 71  83 113  Temp: 98.3 F (36.8 C)  97.6 F (36.4 C)   TempSrc: Oral  Oral   Resp: 24  20   Height: _0  (1.575 m)     Weight: 89.6 kg (197 lb 8.5 oz)     SpO2: 98%  94%      Exam:  General: in no acute distress.  HEENT: No bruits, no goiter.  Heart: Regular rate and rhythm. Lungs: Good air movement, clear  Abdomen: Soft, nontender, nondistended, positive bowel sounds.     Data Reviewed: Basic Metabolic Panel:  Recent Labs Lab 11/09/14 1501 11/10/14 0432  NA 139 140  K 3.9 3.3*  CL 98 95*  CO2 26 30  GLUCOSE 92 90  BUN 14 15  CREATININE 0.83 0.94  CALCIUM 9.5 9.4   Liver Function Tests: No results for input(s): AST, ALT, ALKPHOS, BILITOT, PROT, ALBUMIN in the last 168 hours. No results for input(s):  LIPASE, AMYLASE in the last 168 hours. No results for input(s): AMMONIA in the last 168 hours. CBC:  Recent Labs Lab 11/09/14 1501 11/10/14 0432  WBC 7.3 7.3  NEUTROABS 5.1  --   HGB 15.2* 14.7  HCT 46.6* 45.5  MCV 93.2 94.2  PLT 112* 128*   Cardiac Enzymes: No results for input(s): CKTOTAL, CKMB, CKMBINDEX, TROPONINI in the last 168 hours. BNP (last 3 results)  Recent Labs  11/09/14 1501  PROBNP 1449.0*   CBG:  Recent Labs Lab 11/09/14 1418  GLUCAP 119*    Recent Results (from the past 240 hour(s))  Culture, blood (routine x 2)     Status: None (Preliminary result)   Collection Time: 11/09/14  3:30 PM  Result Value Ref Range Status   Specimen Description BLOOD RIGHT WRIST  Final   Special Requests BOTTLES DRAWN AEROBIC AND ANAEROBIC 2 CC EA  Final   Culture  Setup Time   Final    11/09/2014 21:13 Performed at [facility]    Culture   Final           BLOOD CULTURE RECEIVED NO GROWTH TO DATE CULTURE WILL BE HELD FOR 5 DAYS BEFORE ISSUING A FINAL NEGATIVE REPORT Performed at [facility]    Report Status PENDING  Incomplete  Culture, blood (routine x 2)     Status: None (Preliminary result)   Collection Time: 11/09/14  4:01 PM  Result Value Ref Range Status   Specimen Description BLOOD LEFT HAND  Final   Special Requests BOTTLES DRAWN AEROBIC AND ANAEROBIC 5ML  Final   Culture  Setup Time   Final    11/09/2014 21:13 Performed at [facility]    Culture   Final           BLOOD CULTURE RECEIVED NO GROWTH TO DATE CULTURE WILL BE HELD FOR 5 DAYS BEFORE ISSUING A FINAL NEGATIVE REPORT Performed at [facility]    Report Status PENDING  Incomplete     Studies: Dg Chest 2 View  11/09/2014   CLINICAL DATA:  Cough and increase weakness over the last 4 days  EXAM: CHEST  2 VIEW  COMPARISON:  09/13/2014  FINDINGS: Cardiac shadow remains mildly enlarged. The lungs are well aerated with very mild vascular congestion. No focal  infiltrate is seen. Minimal right basilar atelectasis is noted.  IMPRESSION: Mild vascular congestion and right basilar atelectasis.   Electronically Signed   By: Inez Catalina M.D.   On: 11/09/2014 16:29    Scheduled Meds: . acetaminophen  650 mg Oral TID PC & HS  . ARIPiprazole  10 mg Oral QHS  . aspirin EC  81 mg Oral QHS  . azithromycin  500 mg Intravenous Q24H  . busPIRone  5 mg Oral BID  . digoxin  0.125 mg Oral Daily  . donepezil  23 mg Oral QHS  . escitalopram  20 mg Oral Daily  . ezetimibe  10 mg Oral QHS  . heparin  5,000 Units Subcutaneous 3 times per day  . LORazepam  0.5-1 mg Oral QHS  . nystatin cream   Topical BID  . pantoprazole  40 mg Oral Daily  . simvastatin  40 mg Oral q1800  . sodium chloride  3 mL Intravenous Q12H  . sodium chloride  3 mL Intravenous Q12H  . triamterene-hydrochlorothiazide  0.5 tablet Oral Daily   Continuous Infusions:    Charlynne Cousins  [facility] Pager [phone] If 8PM-8AM, please contact night-coverage at [email or web address], password [facility] 11/10/2014, 12:23 PM  LOS: 1 day

## 2014-11-11 DIAGNOSIS — E46 Unspecified protein-calorie malnutrition: Secondary | ICD-10-CM | POA: Diagnosis present

## 2014-11-11 DIAGNOSIS — E43 Unspecified severe protein-calorie malnutrition: Secondary | ICD-10-CM | POA: Insufficient documentation

## 2014-11-11 MED ORDER — AZITHROMYCIN 500 MG PO TABS
500.0000 mg | ORAL_TABLET | Freq: Every day | ORAL | Status: DC
  Administered 2014-11-11: 500 mg via ORAL
  Filled 2014-11-11: qty 1

## 2014-11-11 MED ORDER — POTASSIUM CHLORIDE 20 MEQ/15ML (10%) PO SOLN
40.0000 meq | Freq: Once | ORAL | Status: AC
  Administered 2014-11-11: 40 meq via ORAL
  Filled 2014-11-11: qty 30

## 2014-11-11 MED ORDER — VANCOMYCIN HCL IN DEXTROSE 1-5 GM/200ML-% IV SOLN
1000.0000 mg | Freq: Once | INTRAVENOUS | Status: AC
  Administered 2014-11-11: 1000 mg via INTRAVENOUS
  Filled 2014-11-11: qty 200

## 2014-11-11 MED ORDER — VANCOMYCIN HCL IN DEXTROSE 750-5 MG/150ML-% IV SOLN
750.0000 mg | Freq: Two times a day (BID) | INTRAVENOUS | Status: DC
  Administered 2014-11-11 – 2014-11-12 (×2): 750 mg via INTRAVENOUS
  Filled 2014-11-11 (×3): qty 150

## 2014-11-11 NOTE — Evaluation (Signed)
Physical Therapy Evaluation Patient Details Name: Sheila BerryLinda Charles MRN: [id number] DOB: 11/02/1941 Today's Date: 11/11/2014   History of Present Illness  Sheila BerryLinda Charles is a 73 y.o. female with advanced alzheimers dementia, history of PAF 15 years ago,   She presents to the ED 11/09/14 with cough and generalized weakness over the past 3 days per her husband. Patient found to be in CHF, afib with RVR. There is mention of home with Hospice Care.  Clinical Impression  Patient was able to mobilize from bed to recliner. Patient will benefit from PT to address problems listed  Below.    Follow Up Recommendations Home health PT;Supervision/Assistance - 24 hour (aide)    Equipment Recommendations  Wheelchair (measurements PT)    Recommendations for Other Services       Precautions / Restrictions Precautions Precautions: Fall Precaution Comments: incontinence, very low vision      Mobility  Bed Mobility Overal bed mobility: Needs Assistance Bed Mobility: Supine to Sit     Supine to sit: Mod assist;HOB elevated     General bed mobility comments: multimodal cues for sitting  up, tactile cues for moving legs  to edge and for sitting up, bed pad used to slide to edge.  Transfers Overall transfer level: Needs assistance Equipment used: 2 person hand held assist Transfers: Sit to/from Stand Sit to Stand: Mod assist;+2 safety/equipment         General transfer comment: multimodal cue for  standing, using 2 hand hold, patient stood right up,   Ambulation/Gait Ambulation/Gait assistance: Mod assist;+2 safety/equipment Ambulation Distance (Feet): 5 Feet Assistive device: 2 person hand held assist Gait Pattern/deviations: Step-through pattern;Decreased weight shift to right;Decreased weight shift to left     General Gait Details: per spouse, holdiomg onto patients  both arms, assisted pt to stand and take  steps to the recliner,multimodal cues to  take steps forward and turn, hand over  hand  tactile cues for reaching back to recliner and sitting down.  Stairs            Wheelchair Mobility    Modified Rankin (Stroke Patients Only)       Balance Overall balance assessment: Needs assistance Sitting-balance support: Feet supported;No upper extremity supported Sitting balance-Leahy Scale: Fair     Standing balance support: During functional activity;Bilateral upper extremity supported Standing balance-Leahy Scale: Poor Standing balance comment: staggers, expresses being afraid of falling                             Pertinent Vitals/Pain Pain Assessment: No/denies pain    Home Living Family/patient expects to be discharged to:: Private residence Living Arrangements: Spouse/significant other   Type of Home: House       Home Layout: One level   Additional Comments: had an aid but patient woukld not allow  her to assist, had HHPT but it ended. Per spouse, he has no other help at home, does not want patient in NH    Prior Function     Gait / Transfers Assistance Needed: per spouse, he leads patient with bilateral hand hold  to ambulate short distances, provides sponge bath at [place].  assists with feeding patione due to sision loss.           Hand Dominance        Extremity/Trunk Assessment   Upper Extremity Assessment: Generalized weakness           Lower Extremity Assessment: Generalized  weakness      Cervical / Trunk Assessment: Normal  Communication      Cognition Arousal/Alertness: Awake/alert Behavior During Therapy: WFL for tasks assessed/performed Overall Cognitive Status: History of cognitive impairments - at baseline                      General Comments      Exercises        Assessment/Plan    PT Assessment Patient needs continued PT services  PT Diagnosis Difficulty walking;Altered mental status   PT Problem List Decreased strength;Decreased activity tolerance;Decreased  mobility;Decreased balance;Decreased knowledge of precautions;Decreased safety awareness;Decreased knowledge of use of DME;Decreased cognition  PT Treatment Interventions Gait training;Functional mobility training;Therapeutic activities;Therapeutic exercise;Patient/family education   PT Goals (Current goals can be found in the Care Plan section) Acute Rehab PT Goals Patient Stated Goal: to return home PT Goal Formulation: With family Time For Goal Achievement: 11/25/14 Potential to Achieve Goals: Good    Frequency Min 3X/week   Barriers to discharge        Co-evaluation               End of Session   Activity Tolerance: Patient tolerated treatment well Patient left: in chair;with call bell/phone within reach;with family/visitor present Nurse Communication: Mobility status         Time: 1030-1045 PT Time Calculation (min) (ACUTE ONLY): 15 min   Charges:   PT Evaluation $Initial PT Evaluation Tier I: 1 Procedure PT Treatments $Gait Training: 8-22 mins   PT G Codes:          Rada HayHill, [name] 11/11/2014, 12:04 PM Blanchard KelchKaren Nealy Karapetian PT [phone]

## 2014-11-11 NOTE — Progress Notes (Signed)
ANTIBIOTIC CONSULT NOTE - INITIAL  Pharmacy Consult for vancomycin Indication: bacteremia  No Known Allergies  Patient Measurements: Height: 5\' 2"  (157.5 cm) Weight: 197 lb 8.5 oz (89.6 kg) IBW/kg (Calculated) : 50.1 Adjusted Body Weight:   Vital Signs: Temp: 97.9 F (36.6 C) (11/14 0515) Temp Source: Axillary (11/14 0515) BP: 125/62 mmHg (11/14 0515) Pulse Rate: 64 (11/14 0515) Intake/Output from previous day: 11/13 0701 - 11/14 0700 In: 790 [P.O.:90; IV Piggyback:700] Out: 175 [Urine:175] Intake/Output from this shift: Total I/O In: 450 [IV Piggyback:450] Out: -   Labs:  Recent Labs  11/09/14 1501 11/10/14 0432  WBC 7.3 7.3  HGB 15.2* 14.7  PLT 112* 128*  CREATININE 0.83 0.94   Estimated Creatinine Clearance: 56.3 mL/min (by C-G formula based on Cr of 0.94). No results for input(s): VANCOTROUGH, VANCOPEAK, VANCORANDOM, GENTTROUGH, GENTPEAK, GENTRANDOM, TOBRATROUGH, TOBRAPEAK, TOBRARND, AMIKACINPEAK, AMIKACINTROU, AMIKACIN in the last 72 hours.   Microbiology: Recent Results (from the past 720 hour(s))  Culture, blood (routine x 2)     Status: None (Preliminary result)   Collection Time: 11/09/14  3:30 PM  Result Value Ref Range Status   Specimen Description BLOOD RIGHT WRIST  Final   Special Requests BOTTLES DRAWN AEROBIC AND ANAEROBIC 2 CC EA  Final   Culture  Setup Time   Final    11/09/2014 21:13 [facility]formed at [facility]    Culture   Final    GRAM POSITIVE COCCI IN CLUSTERS Note: Gram Stain Report Called to,Read Back By and Verified With: Tina GriffithsVALERIE STEWART  11/11/14 AT 0130 RIDK Performed at [facility]    Report Status PENDING  Incomplete  Culture, blood (routine x 2)     Status: None (Preliminary result)   Collection Time: 11/09/14  4:01 PM  Result Value Ref Range Status   Specimen Description BLOOD LEFT HAND  Final   Special Requests BOTTLES DRAWN AEROBIC AND ANAEROBIC 5ML  Final   Culture  Setup Time   Final    11/09/2014  21:13 Performed at [facility]    Culture   Final           BLOOD CULTURE RECEIVED NO GROWTH TO DATE CULTURE WILL BE HELD FOR 5 DAYS BEFORE ISSUING A FINAL NEGATIVE REPORT Performed at [facility]    Report Status PENDING  Incomplete    Medical History: Past Medical History  Diagnosis Date  . Paroxysmal atrial fibrillation   . Vertigo   . Hyperlipidemia   . Hypertension   . Depression   . Memory loss   . Degenerative arthritis   . Closed head injury   . Alzheimer's disease 07/24/2014  . Obesity   . UTI (urinary tract infection)     HX OF UTI    Medications:  Anti-infectives    Start     Dose/Rate Route Frequency Ordered Stop   11/11/14 1800  vancomycin (VANCOCIN) IVPB 750 mg/150 ml premix     750 mg150 mL/hr over 60 Minutes Intravenous Every 12 hours 11/11/14 0630     11/11/14 0215  vancomycin (VANCOCIN) IVPB 1000 mg/200 mL premix     1,000 mg200 mL/hr over 60 Minutes Intravenous  Once 11/11/14 0214 11/11/14 0334   11/09/14 2030  azithromycin (ZITHROMAX) 500 mg in dextrose 5 % 250 mL IVPB     500 mg250 mL/hr over 60 Minutes Intravenous Every 24 hours 11/09/14 2029       Assessment: Patient with bacteremia.  First dose of antibiotics already given.  Goal of Therapy:  Vancomycin trough level 15-20 mcg/ml  Plan:  Measure antibiotic drug levels at steady state Follow up culture results Vancomycin 750mg  iv q12hr  Aleene DavidsonGrimsley Jr, Yeiren Whitecotton Crowford 11/11/2014,6:30 AM

## 2014-11-11 NOTE — Plan of Care (Signed)
Problem: Phase II Progression Outcomes Goal: Pain controlled Outcome: Progressing                  

## 2014-11-11 NOTE — Care Management Note (Addendum)
    Page 1 of 2   11/12/2014     3:04:25 PM CARE MANAGEMENT NOTE 11/12/2014  Patient:  Danelle BerryMULLENS,Haliyah   Account Number:  [id number]  Date Initiated:  11/11/2014  Documentation initiated by:  Lanier ClamMAHABIR,Relda Agosto  Subjective/Objective Assessment:   73 Y/O F ADMITTED W/CHF.NG:EXBMWUXLKGHX:ALZHEIMERS.     Action/Plan:   FROM HOME W/SPOUSE.[facility]HAS RW,3N1.   Anticipated DC Date:  11/12/2014   Anticipated DC Plan:  HOME W HOSPICE CARE      DC Planning Services  CM consult      Choice offered to / List presented to:  C-3 Spouse   DME arranged  HOSPITAL BED  WHEELCHAIR - MANUAL  OVERBED TABLE      DME agency  [facility]     [facility]  HH[facility] RN      Status of service:  In process, will continue to follow Medicare Important Message given?  YES (If response is "NO", the following Medicare IM given date fields will be blank) Date Medicare IM given:  11/12/2014 Medicare IM given by:  [facility]Farida Mcreynolds Date Additional Medicare IM given:   Additional Medicare IM given by:    Discharge Disposition:  HOME W HOSPICE CARE  Per UR Regulation:  Reviewed for med. necessity/level of care/duration of stay  If discussed at Long Length of Stay Meetings, dates discussed:    Comments:  11/12/14 Haizlee Henton RN,BSN NCM [phone] FAMILY TO MEET DME DLEIVERY OF BED,W/C,OVERBED TABLE @ HOME NOW, THE NURSE WILL MEET PATIENT @ HOME. AMBULANCE TRANSP-CSW NOTIFIED.NURSE/MD NOTIFIED.NO FURTHER D/C NEEDS.  [facility] AWAITING D/C ORDER,THEY WILL DELIVER HOSPITAL BED,W/C,OVERBED TABLE LATER TODAY,ARRANGEMENTS COORDINATED W/SPOUSE.THEY WILL HAVE HOSPICE RN TO MEET PATIENT @ HOME.RECOMMEND AMBULANCE TRANSP-CSW NOTIFIED.  11/11/14 Nyela Cortinas RN,BSN NCM [phone] REFERRAL FOR HOME HOSPICE CHOICE.SPOKE TO SPOUSE CLARENCE IN RM ABOUT D/C PLANS, & REFERRAL.[facility] CHOSEN.TC CORY LIASON TEL#[phone],AWARE OF REFERRAL-HE HAS  SPOKEN TO SPOUSE,WILL SEE SPOUSE IN AM.HAVED FAXED W/CONFIRMATION TO [phone]-DEMOGRAPHICS,H&P,PROGRESS NOTES,PALLIATIVE NOTE.MD TO MANAGE HOME HOSPICE-[facility],DME NEEDED:W/C,OVERBED TABLE.LIKELY NEED AMBULANCE TRANSP-CSW NOTIFIED.AWAIT FINAL HOME HOSPICE ORDER @ D/C.

## 2014-11-11 NOTE — Progress Notes (Signed)
SUBJECTIVE:   73 yo hx of alzheimers dementia, PAF admitted with generalized weakness over the previoius 3 days .  He was consulted to follow with possible CHF  Echo from 11/13 shows normal EF of 60-65%  OBJECTIVE:   Vitals:   Filed Vitals:   11/10/14 0900 11/10/14 1500 11/10/14 2146 11/11/14 0515  BP: 94/58 118/62 154/78 125/62  Pulse: 113 84 74 64  Temp:  98.4 F (36.9 C) 98 F (36.7 C) 97.9 F (36.6 C)  TempSrc:  Oral Oral Axillary  Resp:  18 20 18   Height:      Weight:      SpO2:  96% 95% 94%   I&O's:    Intake/Output Summary (Last 24 hours) at 11/11/14 0803 Last data filed at 11/11/14 0406  Gross per 24 hour  Intake    790 ml  Output    175 ml  Net    615 ml        PHYSICAL EXAM General: Well developed, well nourished, in no acute distress Head:   Normal cephalic and atramatic  Lungs: Clear bilaterally to auscultation. Heart:  RR, normal S1S2, , no JVD  Abdomen: abdomen soft and non-tender Msk:  Back normal,  Normal strength and tone for age. Extremities:  No edema.  , no clubbing, no lesions.  Neuro: asleep  Psych: NA, patient is asleep  Skin: No rash   LABS: Basic Metabolic Panel:  Recent Labs  54/08/8110/12/15 1501 11/10/14 0432  NA 139 140  K 3.9 3.3*  CL 98 95*  CO2 26 30  GLUCOSE 92 90  BUN 14 15  CREATININE 0.83 0.94  CALCIUM 9.5 9.4   Liver Function Tests: No results for input(s): AST, ALT, ALKPHOS, BILITOT, PROT, ALBUMIN in the last 72 hours. No results for input(s): LIPASE, AMYLASE in the last 72 hours. CBC:  Recent Labs  11/09/14 1501 11/10/14 0432  WBC 7.3 7.3  NEUTROABS 5.1  --   HGB 15.2* 14.7  HCT 46.6* 45.5  MCV 93.2 94.2  PLT 112* 128*   Cardiac Enzymes: No results for input(s): CKTOTAL, CKMB, CKMBINDEX, TROPONINI in the last 72 hours. BNP: Invalid input(s): POCBNP D-Dimer: No results for input(s): DDIMER in the last 72 hours. Hemoglobin A1C: No results for input(s): HGBA1C in the last 72 hours. Fasting Lipid  Panel: No results for input(s): CHOL, HDL, LDLCALC, TRIG, CHOLHDL, LDLDIRECT in the last 72 hours. Thyroid Function Tests: No results for input(s): TSH, T4TOTAL, T3FREE, THYROIDAB in the last 72 hours.  Invalid input(s): FREET3 Anemia Panel: No results for input(s): VITAMINB12, FOLATE, FERRITIN, TIBC, IRON, RETICCTPCT in the last 72 hours. Coag Panel:   No results found for: INR, PROTIME  RADIOLOGY: Dg Chest 2 View  11/09/2014   CLINICAL DATA:  Cough and increase weakness over the last 4 days  EXAM: CHEST  2 VIEW  COMPARISON:  09/13/2014  FINDINGS: Cardiac shadow remains mildly enlarged. The lungs are well aerated with very mild vascular congestion. No focal infiltrate is seen. Minimal right basilar atelectasis is noted.  IMPRESSION: Mild vascular congestion and right basilar atelectasis.   Electronically Signed   By: Alcide CleverMark  Lukens M.D.   On: 11/09/2014 16:29    Tele:  NSR , normal rate  ASSESSMENT: Roosvelt Harps/PLAN:    1. Hx of paroxysmal Afib - currently in NSR    2. Chronic diastolic chf - EF is normal .  I have discussed with Dr. Arman Bogusrtez.  He thinks the dyspnea was likely due to bronchitis.  We will sign off. Call for questions .  Follow up with Dr. Ladona Ridgelaylor if needed in the office.    Elyn AquasPhilip Emani Morad Jr., MD  11/11/2014  8:03 AM

## 2014-11-11 NOTE — Progress Notes (Addendum)
[facility] PROGRESS NOTE  Filed Weights   11/09/14 2125  Weight: 89.6 kg (197 lb 8.5 oz)        Intake/Output Summary (Last 24 hours) at 11/11/14 0813 Last data filed at 11/11/14 0406  Gross per 24 hour  Intake    790 ml  Output    175 ml  Net    615 ml     Assessment/Plan: Acute CHF: - Started on IV diuretics, with good output. Pt not on a diuretic at home. Vol overload was most likely due to Afib with RVR. - Monitor Strict I and O's. Daily weights. - Echo showed estimated ejection fraction was in the range of 60% to 65%. Poor acoustic windows limit study. pending, monitor electrolytes replete as needed.  Hypokalemia: - replete K.  Acute bronchitis - Transition abx to oral. If afebrile home. - afebrile.  Atrial fibrillation with RVR - On digoxin which she has not taken at home for several weeks. Now back in SR. - CHADS2Vasc score of 2, on ASA. Not candidate for antocoagulant.  Fungal dermatitis: - Nystatin ordered  Advanced alzheimer's dementia: - PMT consulted. - she  now is basically functionally bedbound.  - eligibility for hospice pending.   Code Status: FULL Family Communication: NONE  Disposition Plan: Home in 24hrs   Consultants: Cardiology  Procedures: ECHO: pending  Antibiotics:  azithro  HPI/Subjective: No complains  Objective: Filed Vitals:   11/10/14 0900 11/10/14 1500 11/10/14 2146 11/11/14 0515  BP: 94/58 118/62 154/78 125/62  Pulse: 113 84 74 64  Temp:  98.4 F (36.9 C) 98 F (36.7 C) 97.9 F (36.6 C)  TempSrc:  Oral Oral Axillary  Resp:  18 20 18   Height:      Weight:      SpO2:  96% 95% 94%     Exam:  General: in no acute distress.  HEENT: No bruits, no goiter.  Heart: Regular rate and rhythm. Lungs: Good air movement, clear  Abdomen: Soft, nontender, nondistended, positive bowel sounds.     Data Reviewed: Basic Metabolic Panel:  Recent Labs Lab 11/09/14 1501 11/10/14 0432  NA 139 140  K  3.9 3.3*  CL 98 95*  CO2 26 30  GLUCOSE 92 90  BUN 14 15  CREATININE 0.83 0.94  CALCIUM 9.5 9.4   Liver Function Tests: No results for input(s): AST, ALT, ALKPHOS, BILITOT, PROT, ALBUMIN in the last 168 hours. No results for input(s): LIPASE, AMYLASE in the last 168 hours. No results for input(s): AMMONIA in the last 168 hours. CBC:  Recent Labs Lab 11/09/14 1501 11/10/14 0432  WBC 7.3 7.3  NEUTROABS 5.1  --   HGB 15.2* 14.7  HCT 46.6* 45.5  MCV 93.2 94.2  PLT 112* 128*   Cardiac Enzymes: No results for input(s): CKTOTAL, CKMB, CKMBINDEX, TROPONINI in the last 168 hours. BNP (last 3 results)  Recent Labs  11/09/14 1501  PROBNP 1449.0*   CBG:  Recent Labs Lab 11/09/14 1418  GLUCAP 119*    Recent Results (from the past 240 hour(s))  Culture, blood (routine x 2)     Status: None (Preliminary result)   Collection Time: 11/09/14  3:30 PM  Result Value Ref Range Status   Specimen Description BLOOD RIGHT WRIST  Final   Special Requests BOTTLES DRAWN AEROBIC AND ANAEROBIC 2 CC EA  Final   Culture  Setup Time   Final    11/09/2014 21:13 Performed at [facility]    Culture  Final    GRAM POSITIVE COCCI IN CLUSTERS Note: Gram Stain Report Called to,Read Back By and Verified With: Tina GriffithsVALERIE STEWART  11/11/14 AT 0130 RIDK Performed at [facility]    Report Status PENDING  Incomplete  Culture, blood (routine x 2)     Status: None (Preliminary result)   Collection Time: 11/09/14  4:01 PM  Result Value Ref Range Status   Specimen Description BLOOD LEFT HAND  Final   Special Requests BOTTLES DRAWN AEROBIC AND ANAEROBIC 5ML  Final   Culture  Setup Time   Final    11/09/2014 21:13 Performed at [facility]    Culture   Final           BLOOD CULTURE RECEIVED NO GROWTH TO DATE CULTURE WILL BE HELD FOR 5 DAYS BEFORE ISSUING A FINAL NEGATIVE REPORT Performed at [facility]    Report Status PENDING  Incomplete     Studies: Dg Chest  2 View  11/09/2014   CLINICAL DATA:  Cough and increase weakness over the last 4 days  EXAM: CHEST  2 VIEW  COMPARISON:  09/13/2014  FINDINGS: Cardiac shadow remains mildly enlarged. The lungs are well aerated with very mild vascular congestion. No focal infiltrate is seen. Minimal right basilar atelectasis is noted.  IMPRESSION: Mild vascular congestion and right basilar atelectasis.   Electronically Signed   By: Alcide CleverMark  Lukens M.D.   On: 11/09/2014 16:29    Scheduled Meds: . acetaminophen  650 mg Oral TID PC & HS  . ARIPiprazole  10 mg Oral QHS  . aspirin EC  81 mg Oral QHS  . azithromycin  500 mg Intravenous Q24H  . busPIRone  5 mg Oral BID  . digoxin  0.125 mg Oral Daily  . donepezil  23 mg Oral QHS  . escitalopram  20 mg Oral Daily  . ezetimibe  10 mg Oral QHS  . feeding supplement (ENSURE COMPLETE)  237 mL Oral BID BM  . heparin  5,000 Units Subcutaneous 3 times per day  . LORazepam  0.5-1 mg Oral QHS  . nystatin cream   Topical BID  . pantoprazole  40 mg Oral Daily  . simvastatin  40 mg Oral q1800  . sodium chloride  3 mL Intravenous Q12H  . sodium chloride  3 mL Intravenous Q12H  . triamterene-hydrochlorothiazide  0.5 tablet Oral Daily  . vancomycin  750 mg Intravenous Q12H   Continuous Infusions:    Marinda ElkFELIZ ORTIZ, Serenitie Vinton  [facility] Pager [phone] If 8PM-8AM, please contact night-coverage at [email or web address], password [email or web address] 11/11/2014, 8:13 AM  LOS: 2 days

## 2014-11-11 NOTE — Progress Notes (Signed)
Patient KG:MWNUU:Cyenna Flanagan      DOB: 01/10/1941      VOZ:[id number]   Palliative Medicine Team at [facility] Progress Note    Checked in with husband this afternoon. He is planning on using community hospice.  They will meet with hospice liaison tomorrow.  If we can be of assistance please let me know.   Orvis BrillAaron J. Daziah Hesler D.O. Palliative Medicine Team at [facility]  Pager: [phone] Team Phone: [phone]

## 2014-11-11 NOTE — Progress Notes (Signed)
CRITICAL VALUE ALERT  Critical value received:  Blood culture positive for gram-positive cocci in clusters, aerobic bottle  Date of notification:  11/11/2014  Time of notification:  0126  Critical value read back:Yes.    Nurse who received alert:  Tina GriffithsValerie Natthew Marlatt, RN  MD notified (1st page):  Elray McgregorMary Lynch, NP  Time of first page:  0127  MD notified (2nd page): n/a  Time of second page: n/a  Responding MD:  Elray McgregorMary Lynch, NP  Time MD responded:  [phone]

## 2014-11-12 DIAGNOSIS — E43 Unspecified severe protein-calorie malnutrition: Secondary | ICD-10-CM

## 2014-11-12 DIAGNOSIS — I5033 Acute on chronic diastolic (congestive) heart failure: Secondary | ICD-10-CM

## 2014-11-12 LAB — BASIC METABOLIC PANEL
ANION GAP: 12 (ref 5–15)
BUN: 14 mg/dL (ref 6–23)
CHLORIDE: 97 meq/L (ref 96–112)
CO2: 30 meq/L (ref 19–32)
CREATININE: 0.73 mg/dL (ref 0.50–1.10)
Calcium: 9.3 mg/dL (ref 8.4–10.5)
GFR calc non Af Amer: 83 mL/min — ABNORMAL LOW (ref >90)
Glucose, Bld: 98 mg/dL (ref 70–99)
POTASSIUM: 3.2 meq/L — AB (ref 3.7–5.3)
Sodium: 139 mEq/L (ref 137–147)

## 2014-11-12 LAB — CULTURE, BLOOD (ROUTINE X 2)

## 2014-11-12 MED ORDER — GUAIFENESIN-DM 100-10 MG/5ML PO SYRP: 5.0000 mL | ORAL_SOLUTION | ORAL | Status: AC | PRN

## 2014-11-12 MED ORDER — NYSTATIN 100000 UNIT/GM EX CREA: TOPICAL_CREAM | Freq: Two times a day (BID) | CUTANEOUS | Status: AC

## 2014-11-12 MED ORDER — ENSURE COMPLETE PO LIQD: 237.0000 mL | Freq: Two times a day (BID) | ORAL | Status: AC

## 2014-11-12 MED ORDER — LORAZEPAM 0.5 MG PO TABS: 0.5000 mg | ORAL_TABLET | Freq: Three times a day (TID) | ORAL | Status: DC | PRN

## 2014-11-12 MED ORDER — MORPHINE SULFATE (CONCENTRATE) 10 MG /0.5 ML PO SOLN: 5.0000 mg | ORAL | Status: AC | PRN

## 2014-11-12 MED ORDER — POTASSIUM CHLORIDE 20 MEQ/15ML (10%) PO SOLN: 40.0000 meq | Freq: Every day | ORAL | Status: DC

## 2014-11-12 MED ORDER — FUROSEMIDE 20 MG PO TABS: 20.0000 mg | ORAL_TABLET | Freq: Every day | ORAL | Status: DC

## 2014-11-12 MED ORDER — BUSPIRONE HCL 5 MG PO TABS: 5.0000 mg | ORAL_TABLET | Freq: Two times a day (BID) | ORAL | Status: AC

## 2014-11-12 MED ORDER — DIGOXIN 125 MCG PO TABS: 0.1250 mg | ORAL_TABLET | Freq: Every day | ORAL | Status: AC

## 2014-11-12 MED ORDER — AMOXICILLIN-POT CLAVULANATE 500-125 MG PO TABS: 1.0000 | ORAL_TABLET | Freq: Three times a day (TID) | ORAL | Status: AC

## 2014-11-12 NOTE — Progress Notes (Signed)
Pt going home with hospice but discussed low salt intake to help control fluid retention to family

## 2014-11-12 NOTE — Plan of Care (Signed)
Problem: Phase I Progression Outcomes Goal: Dyspnea controlled at rest (HF) Outcome: Completed/Met Date Met:  11/12/14 Goal: Up in chair, BRP Outcome: Completed/Met Date Met:  11/12/14 Goal: Voiding-avoid urinary catheter unless indicated Outcome: Completed/Met Date Met:  11/12/14 Goal: Hemodynamically stable Outcome: Completed/Met Date Met:  11/12/14  Problem: Phase II Progression Outcomes Goal: Tolerating diet Outcome: Completed/Met Date Met:  11/12/14

## 2014-11-12 NOTE — Discharge Summary (Signed)
Physician Discharge Summary  Sheila BerryLinda Charles ZOX:[id number] DOB: 01/15/1941 DOA: 11/09/2014  PCP: Neena RhymesKatherine Tabori, MD  Admit date: 11/09/2014 Discharge date: 11/12/2014  Time spent: >30 minutes  Recommendations for Outpatient Follow-up:  Continue medication adjustment for comfort care  Discharge Diagnoses:  Acute diastolic CHF Paroxysmal atrial fibrillation with RVR Alzheimer's disease HTN (hypertension) Fungal dermatitis Acute bronchitis Protein-calorie malnutrition, severe  Discharge Condition: stable. Will discharge home with home hospice.  Diet recommendation: low sodium diet  Filed Weights   11/09/14 2125  Weight: 89.6 kg (197 lb 8.5 oz)    History of present illness:  73 y.o. female with advanced alzheimer dementia, history of PAF 15 years ago, husband believes she has been in sinus rhythm since a cardioversion at that time. Presented to ED with productive cough and generalized weakness over the past 3 days prior to admission. Patient complaining of sore throat and chills, but denies frank fever.   Hospital Course:  Acute diastolic CHF: - Started on IV diuretics, with good output. Pt not on a diuretic at home. Vol overload was most likely due to Afib with RVR. - Advised to check daily weights and follow low sodium diet. - Echo showed estimated ejection fraction was in the range of 60% to 65%. Poor acoustic windows limit study.  -currently on NSR -will discharge on low dose lasix (still with mild bibasilar crackles)  Hypokalemia: -due to diuretics and overall poor intake -will discharge on maintenance potassium  Acute bronchitis -will discharge with 4 more days of augmentin -afebrile. -PRN robitussin with guaifenesin for cough  Atrial fibrillation with RVR -On digoxin which she has not taken at home for several weeks. Now back in SR. -CHADS2Vasc score of 2, on ASA. Not candidate for antocoagulant. -Resume daily dose of digoxin   Fungal  dermatitis: -Continue Nystatin  Advanced alzheimer's dementia: -She now is basically functionally bedbound.  -Palliative care consulted and after discussing with family plan is for home hospice -continue aricept  -continue supportive care  Severe protein calorie malnutrition -will discharge on ensure  Procedures:  2-D echo: The cavity size was normal. Wall thickness was normal. Systolic function was normal. The estimated ejection fraction was in the range of 60% to 65%.  Consultations:  Cardiology   Palliative Care  Discharge Exam: Filed Vitals:   11/12/14 1438  BP: 138/67  Pulse: 60  Temp: 98.8 F (37.1 C)  Resp: 18    General: afebrile, no CP; still with some productive cough; no SOB Cardiovascular: NSR, no rubs or gallops Respiratory: scattered rhonchi; decrease BS at bases Abd: soft, NT, ND, positive BS Extremities: trace edema bilaterally  Discharge Instructions You were cared for by a hospitalist during your hospital stay. If you have any questions about your discharge medications or the care you received while you were in the hospital after you are discharged, you can call the unit and asked to speak with the hospitalist on call if the hospitalist that took care of you is not available. Once you are discharged, your primary care physician will handle any further medical issues. Please note that NO REFILLS for any discharge medications will be authorized once you are discharged, as it is imperative that you return to your primary care physician (or establish a relationship with a primary care physician if you do not have one) for your aftercare needs so that they can reassess your need for medications and monitor your lab values.  Discharge Instructions    Discharge instructions    Complete by:  As directed   Maintain good hydration Take medications as prescribed Follow instructions/rec's from hospice care Use roxanole and ativan as needed for symptoms control  and comfort care Keep your sodium intake less than 2.5grams per day          Current Discharge Medication List    START taking these medications   Details  amoxicillin-clavulanate (AUGMENTIN) 500-125 MG per tablet Take 1 tablet (500 mg total) by mouth 3 (three) times daily. Qty: 12 tablet, Refills: 0    feeding supplement, ENSURE COMPLETE, (ENSURE COMPLETE) LIQD Take 237 mLs by mouth 2 (two) times daily between meals.    furosemide (LASIX) 20 MG tablet Take 1 tablet (20 mg total) by mouth daily. Qty: 30 tablet, Refills: 0    guaiFENesin-dextromethorphan (ROBITUSSIN DM) 100-10 MG/5ML syrup Take 5 mLs by mouth every 4 (four) hours as needed for cough. Qty: 118 mL, Refills: 0    Morphine Sulfate (MORPHINE CONCENTRATE) 10 mg / 0.5 ml concentrated solution Take 0.25 mLs (5 mg total) by mouth every 2 (two) hours as needed for moderate pain, severe pain or shortness of breath. Qty: 30 mL, Refills: 0    nystatin cream (MYCOSTATIN) Apply topically 2 (two) times daily. Apply to abdominal panus for skin fungal infection Qty: 30 g, Refills: 0    potassium chloride 20 MEQ/15ML (10%) SOLN Take 30 mLs (40 mEq total) by mouth daily. Qty: 450 mL, Refills: 0      CONTINUE these medications which have CHANGED   Details  busPIRone (BUSPAR) 5 MG tablet Take 1 tablet (5 mg total) by mouth 2 (two) times daily. Qty: 60 tablet, Refills: 0    digoxin (LANOXIN) 0.125 MG tablet Take 1 tablet (0.125 mg total) by mouth daily.    LORazepam (ATIVAN) 0.5 MG tablet Take 1-2 tablets (0.5-1 mg total) by mouth every 8 (eight) hours as needed for anxiety or sleep (agitation). Qty: 30 tablet, Refills: 0      CONTINUE these medications which have NOT CHANGED   Details  Acetaminophen (TYLENOL ARTHRITIS PAIN PO) Take 2 tablets by mouth 2 (two) times daily.    ARIPiprazole (ABILIFY) 10 MG tablet Take 10 mg by mouth at bedtime.     aspirin EC 81 MG tablet Take 81 mg by mouth at bedtime.    donepezil (ARICEPT)  23 MG TABS tablet Take 1 tablet (23 mg total) by mouth at bedtime. Qty: 30 tablet, Refills: 5    escitalopram (LEXAPRO) 20 MG tablet Take 20 mg by mouth daily.    OVER THE COUNTER MEDICATION Take 1 tablet by mouth daily. Legatrin PM    pantoprazole (PROTONIX) 40 MG tablet Take 40 mg by mouth daily.      STOP taking these medications     ezetimibe (ZETIA) 10 MG tablet      HYDROcodone-acetaminophen (NORCO/VICODIN) 5-325 MG per tablet      simvastatin (ZOCOR) 40 MG tablet      triamterene-hydrochlorothiazide (MAXZIDE-25) 37.5-25 MG per tablet      vitamin E 400 UNIT capsule        No Known Allergies Follow-up Information    Please follow up.   Why:  HOME HOSPICE RN/DME   Contact information:   [facility] [address] [place] [address] [phone]      The results of significant diagnostics from this hospitalization (including imaging, microbiology, ancillary and laboratory) are listed below for reference.    Significant Diagnostic Studies: Dg Chest 2 View  11/09/2014   CLINICAL DATA:  Cough and increase weakness over the last 4 days  EXAM: CHEST  2 VIEW  COMPARISON:  09/13/2014  FINDINGS: Cardiac shadow remains mildly enlarged. The lungs are well aerated with very mild vascular congestion. No focal infiltrate is seen. Minimal right basilar atelectasis is noted.  IMPRESSION: Mild vascular congestion and right basilar atelectasis.   Electronically Signed   By: Alcide CleverMark  Lukens M.D.   On: 11/09/2014 16:29    Microbiology: Recent Results (from the past 240 hour(s))  Culture, blood (routine x 2)     Status: None   Collection Time: 11/09/14  3:30 PM  Result Value Ref Range Status   Specimen Description BLOOD RIGHT WRIST  Final   Special Requests BOTTLES DRAWN AEROBIC AND ANAEROBIC 2 CC EA  Final   Culture  Setup Time   Final    11/09/2014 21:13 Performed at [facility]    Culture   Final    STAPHYLOCOCCUS SPECIES (COAGULASE  NEGATIVE) Note: THE SIGNIFICANCE OF ISOLATING THIS ORGANISM FROM A SINGLE SET OF BLOOD CULTURES WHEN MULTIPLE SETS ARE DRAWN IS UNCERTAIN. PLEASE NOTIFY THE MICROBIOLOGY DEPARTMENT WITHIN ONE WEEK IF SPECIATION AND SENSITIVITIES ARE REQUIRED. Note: Gram Stain Report Called to,Read Back By and Verified With: Tina GriffithsVALERIE STEWART  11/11/14 AT 0130 RIDK Performed at [facility]    Report Status 11/12/2014 FINAL  Final  Culture, blood (routine x 2)     Status: None (Preliminary result)   Collection Time: 11/09/14  4:01 PM  Result Value Ref Range Status   Specimen Description BLOOD LEFT HAND  Final   Special Requests BOTTLES DRAWN AEROBIC AND ANAEROBIC 5ML  Final   Culture  Setup Time   Final    11/09/2014 21:13 Performed at [facility]    Culture   Final           BLOOD CULTURE RECEIVED NO GROWTH TO DATE CULTURE WILL BE HELD FOR 5 DAYS BEFORE ISSUING A FINAL NEGATIVE REPORT Performed at [facility]    Report Status PENDING  Incomplete     Labs: Basic Metabolic Panel:  Recent Labs Lab 11/09/14 1501 11/10/14 0432 11/12/14 0600  NA 139 140 139  K 3.9 3.3* 3.2*  CL 98 95* 97  CO2 26 30 30   GLUCOSE 92 90 98  BUN 14 15 14   CREATININE 0.83 0.94 0.73  CALCIUM 9.5 9.4 9.3   CBC:  Recent Labs Lab 11/09/14 1501 11/10/14 0432  WBC 7.3 7.3  NEUTROABS 5.1  --   HGB 15.2* 14.7  HCT 46.6* 45.5  MCV 93.2 94.2  PLT 112* 128*   BNP (last 3 results)  Recent Labs  11/09/14 1501  PROBNP 1449.0*   CBG:  Recent Labs Lab 11/09/14 1418  GLUCAP 119*    Signed:  Vassie LollMadera, Josejulian Tarango  [facility] 11/12/2014, 3:35 PM

## 2014-11-12 NOTE — Progress Notes (Signed)
CSW consulted for transportation needs. Patient will need non-emergency ambulance transport home. CSW confirmed home address with patient's family at bedside. [facility] called for transport. No other CSW needs identified - CSW signing off.   Sheila MaxinKelly Jontavious Commons, LCSW [facility] Clinical Social Worker cell #: [phone]

## 2014-11-13 ENCOUNTER — Telehealth: Payer: Self-pay | Admitting: Family Medicine

## 2014-11-13 ENCOUNTER — Telehealth: Payer: Self-pay

## 2014-11-13 NOTE — Telephone Encounter (Signed)
FYI

## 2014-11-13 NOTE — Telephone Encounter (Signed)
Please advise 

## 2014-11-13 NOTE — Telephone Encounter (Signed)
Caller name: debbie from hospice Relation to pt: Call back number: [phone] Pharmacy:  Reason for call:   Debbie from hospice stating that she needs orders to fill up med planner and to see if dr Beverely Lowtabori wants to continue all medications for patient.

## 2014-11-13 NOTE — Telephone Encounter (Signed)
[facility]: [facility] (After Hours Triage) Fax: [phone] From: [facility] Date/ Time: 11/12/2014 9:53 AM Taken UJ:WJXBJBy:Helen Yousef, RN Caller: Denyse Amassorey  [facility] Patient: Sheila BerryMullens, [name] DOB: 03/26/1941 Phone: [phone] Reason for Call: [facility]. End stage alzheimers, CHF, and pneumonia. Hospital will d/c today and she is appropriate for palliative care and they are ready to accept her and need an order from PCP to do so. Advised him to get order for hospice care from discharging hospital physician and contact PCP on Monday. Regarding Appointment: Appt Date: Appt Time: Unknown Provider: Reason: Details: Outcome:

## 2014-11-13 NOTE — Telephone Encounter (Signed)
Ok to fill med box for pt/family Until husband agrees that pt should be DNR, it would seem that we need to continue her medication.  Also, please ask Hospice if they have an attending physician that can sign orders and help make these decisions w/ pt/family

## 2014-11-13 NOTE — Telephone Encounter (Signed)
Verbal order per Dr. Beverely Lowabori given for medication preparation by hospice nurse and as well as symptom management by Dr. Lovell SheehanJenkins, Hospice Medical Director.   Eunice BlaseDebbie also shared that hospice will only be with patient 1-2 hours/day, 5 days/week, no weekends.  Given patient's condition, Eunice BlaseDebbie does not feel that husband will be able to take care of patient by himself.  A hospice Stand Up Meeting/team meeting is scheduled for tomorrow morning at 8:45 am and Debbie plans to bring up case then for further direction regarding patient's care.

## 2014-11-15 ENCOUNTER — Telehealth: Payer: Self-pay | Admitting: *Deleted

## 2014-11-15 DIAGNOSIS — G309 Alzheimer's disease, unspecified: Secondary | ICD-10-CM | POA: Diagnosis not present

## 2014-11-15 LAB — CULTURE, BLOOD (ROUTINE X 2): Culture: NO GROWTH

## 2014-11-15 NOTE — Telephone Encounter (Signed)
Received hospice certification and plan of care via fax [facility] [place]. Forms forwarded to Dr. Beverely Lowabori. JG//CMA

## 2014-11-16 ENCOUNTER — Telehealth: Payer: Self-pay | Admitting: Family Medicine

## 2014-11-16 MED ORDER — LORAZEPAM 0.5 MG PO TABS: 0.5000 mg | ORAL_TABLET | Freq: Two times a day (BID) | ORAL | Status: DC

## 2014-11-16 NOTE — Telephone Encounter (Signed)
Caller name: Hendricks MiloJudy Walker-Hospice Relation to pt: Call back number: [phone] Pharmacy: [facility] [address]  Reason for call:   Patient is on lorazepam 0.5mg  at night. Patient has high anxiety and thinks that patient may need something during the day as well

## 2014-11-16 NOTE — Telephone Encounter (Signed)
Ok to increase to BID dosing for symptom relief

## 2014-11-16 NOTE — Telephone Encounter (Signed)
New rx sent to pharmacy and hospice notified.

## 2014-11-17 NOTE — Telephone Encounter (Signed)
Signed forms faxed to [facility] [place] at [phone]. JG//CMA

## 2014-11-20 ENCOUNTER — Telehealth: Payer: Self-pay | Admitting: Family Medicine

## 2014-11-20 NOTE — Telephone Encounter (Signed)
Caller name:Sanderlin, Clerence Relation to pt:spouse Call back numVW:UJWJXBber:[phone] Pharmacy:  Reason for call: pt is needing rx for hydrocodone, spouse states he does not give the meds to her everyday, just when she is in pain, states pt has 4 pills left. rx was not in med list, however states Ramon Dredgedward wrote the rx for her.

## 2014-11-21 NOTE — Telephone Encounter (Signed)
[name] - [facility] [phone] [facility] [address]  Lurena JoinerRebecca called to say that [name] needs a new prescription for her Hydrocodone and for muralax. She also would like to talk to you about [name]'s liquid potassium, she is not taking it because she does not like the taste, so does she need to continue taking this or maybe change to pill form.

## 2014-11-21 NOTE — Telephone Encounter (Signed)
Please advise 

## 2014-11-22 MED ORDER — POTASSIUM CHLORIDE 20 MEQ PO PACK: 20.0000 meq | PACK | Freq: Every day | ORAL | Status: AC

## 2014-11-22 MED ORDER — POLYETHYLENE GLYCOL 3350 17 GM/SCOOP PO POWD: 17.0000 g | Freq: Two times a day (BID) | ORAL | Status: AC | PRN

## 2014-11-22 NOTE — Telephone Encounter (Signed)
Noted, advise pt husband and hospice again.

## 2014-11-22 NOTE — Telephone Encounter (Signed)
No need for hydrocodone prescription- pt has morphine on med list for pain (per Hospice) Ok for Miralax prescription Pt does need to be on K+ due to hypokalemia during recent hospitalization.  Ok to switch to K+ sprinkles (packet/granules) to mix in applesauce or food

## 2014-11-22 NOTE — Telephone Encounter (Signed)
It gets confusing for pt to have multiple narcotics on med list and could result in possible overdose.

## 2014-11-22 NOTE — Telephone Encounter (Signed)
Spoke with McDermottBecky with hopice and advised on Dr. Rennis Goldentabori's response and that the medications were filled. Kriste BasqueBecky also states that pt husband only wants to use morphine for severe pain. I told Becky that Dr. Beverely Lowtabori would prefer pt to be on morphine for the pain. She states that she would try to explain this to pt husband.

## 2014-12-01 IMAGING — CR DG HAND COMPLETE 3+V*L*
3 series · 3 of 3 positions shown · non-contrast
Comparison: 09/13/2014

CLINICAL DATA: Left hand pain and swelling

EXAM:
LEFT HAND - COMPLETE 3+ VIEW

[x hand pa left]
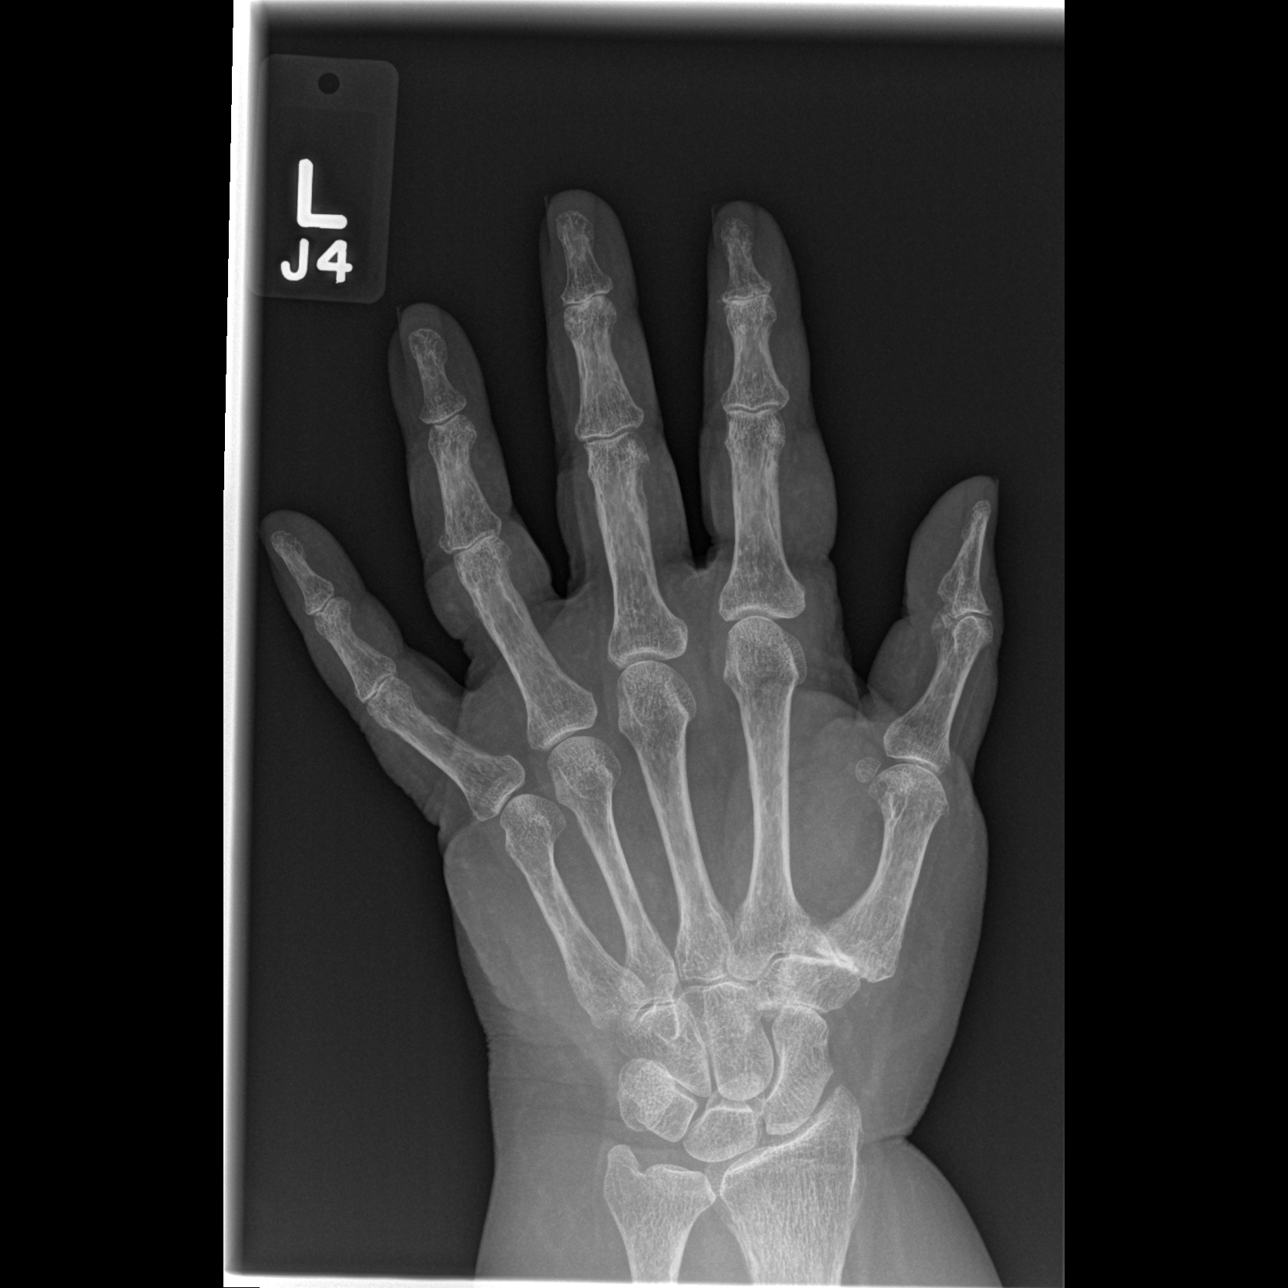

[x hand oblique left]
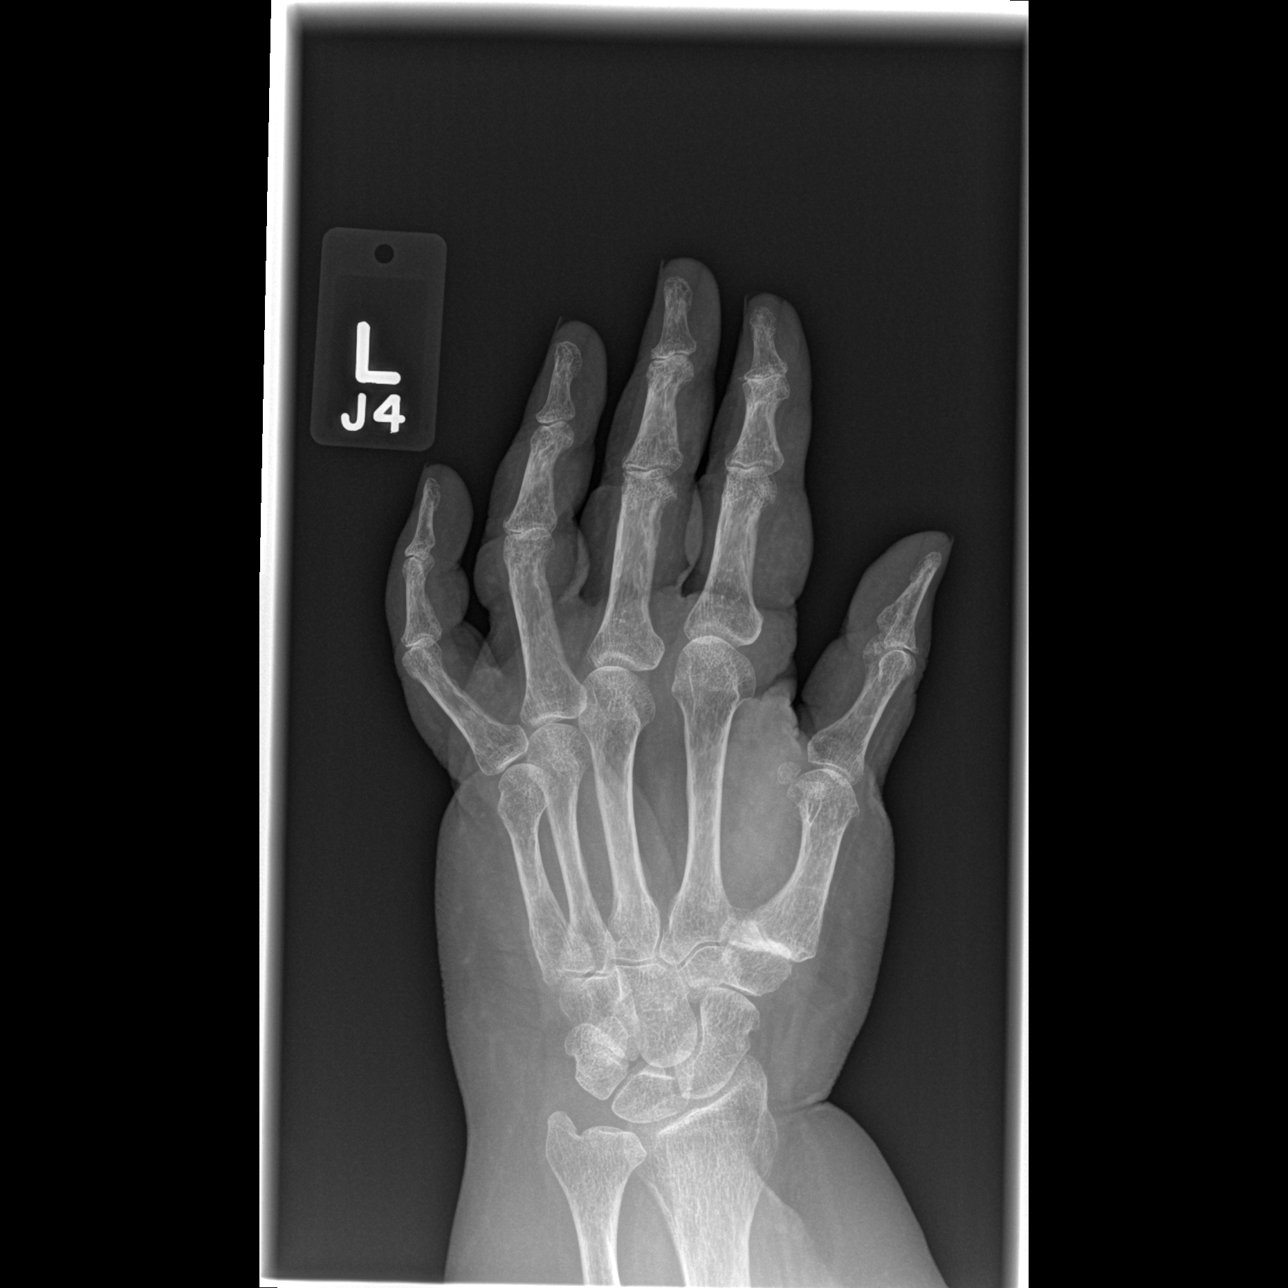

[x hand lat left]
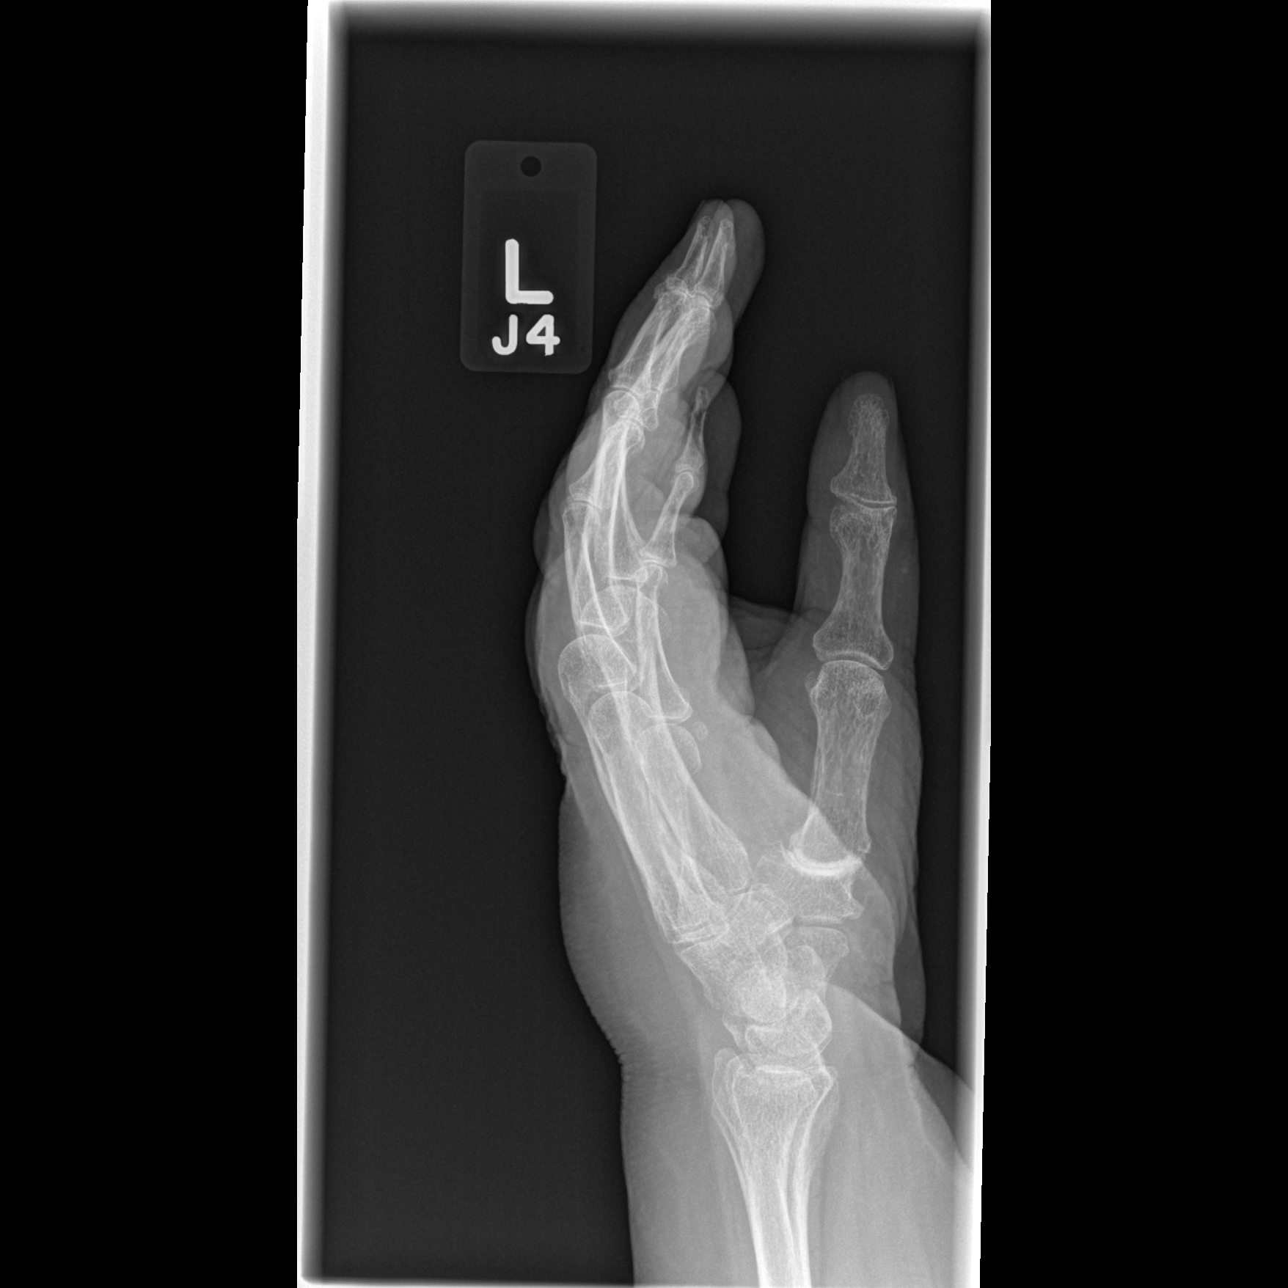

[3 of 3 positions shown; findings below may reference images not displayed]

FINDINGS: Diffuse osteopenia noted. Diffuse joint space loss of the DIP and
PIP joints. Sclerosis and joint space loss of the left first CMC
joint. These findings are compatible with osteoarthritis. No acute
fracture demonstrated. Normal alignment.
IMPRESSION: Evidence of osteoarthritis and osteopenia. No acute osseous finding.

## 2014-12-07 ENCOUNTER — Ambulatory Visit: Payer: Medicare HMO | Admitting: Family Medicine

## 2014-12-08 ENCOUNTER — Other Ambulatory Visit: Payer: Self-pay | Admitting: General Practice

## 2014-12-08 MED ORDER — ARIPIPRAZOLE 10 MG PO TABS: 10.0000 mg | ORAL_TABLET | Freq: Every day | ORAL | Status: AC

## 2014-12-12 ENCOUNTER — Telehealth: Payer: Self-pay | Admitting: Family Medicine

## 2014-12-12 MED ORDER — FUROSEMIDE 20 MG PO TABS: 20.0000 mg | ORAL_TABLET | Freq: Every day | ORAL | Status: AC

## 2014-12-12 NOTE — Telephone Encounter (Signed)
Caller name:Debbie [facility] Relation to WU:JWJXpt:none Call back number:[phone] [facility]  Reason for call: Eunice BlaseDebbie would like to know if Dr. Beverely Lowabori wants the pt to continue using rx furosemide (LASIX) 20 MG tablet  And if so pt will need a rx for it.

## 2014-12-12 NOTE — Telephone Encounter (Signed)
Yes- the Lasix will control her swelling and possibly SOB.  Please send in new script

## 2014-12-12 NOTE — Telephone Encounter (Signed)
Med filled will notify Hospice.

## 2015-01-11 ENCOUNTER — Telehealth: Payer: Self-pay | Admitting: Family Medicine

## 2015-01-11 MED ORDER — LORAZEPAM 0.5 MG PO TABS: 0.5000 mg | ORAL_TABLET | Freq: Four times a day (QID) | ORAL | Status: DC | PRN

## 2015-01-11 NOTE — Telephone Encounter (Signed)
Med filled.  

## 2015-01-11 NOTE — Telephone Encounter (Signed)
Ok to change to q6 prn

## 2015-01-11 NOTE — Telephone Encounter (Signed)
Caller name: judy walker--hospice Relation to pt: Call back number: [phone] Pharmacy:  Reason for call:   Was out visiting patient today and patient husband requesting that Dr. Beverely Lowabori switches lorazepam from 0.5 twice daily to every 6 PRN. He states that twice daily is not holding.

## 2015-01-11 NOTE — Telephone Encounter (Signed)
Could not reach hospice, so contacted pt husband and notified.

## 2015-01-12 ENCOUNTER — Other Ambulatory Visit: Payer: Self-pay | Admitting: Neurology

## 2015-01-25 ENCOUNTER — Telehealth: Payer: Self-pay | Admitting: Family Medicine

## 2015-01-25 ENCOUNTER — Ambulatory Visit: Payer: Medicare HMO | Admitting: Adult Health

## 2015-01-25 MED ORDER — LORAZEPAM 0.5 MG PO TABS: ORAL_TABLET | ORAL | Status: AC

## 2015-01-25 NOTE — Telephone Encounter (Signed)
Caller name:Judy Walker-[facility] Relation to ZO:XWRUpt:none Call back number:[phone] Pharmacy:  Reason for call: would like a call back, states pt is having trouble sleeping, and breathing, requesting a order for O2 2 liters as needed and something to help her sleep.

## 2015-01-25 NOTE — Telephone Encounter (Signed)
New prescription written with SIG: 1 q am, 1 q pm, and 2 QHS. Verbal ok also given for the o2.

## 2015-01-25 NOTE — Telephone Encounter (Signed)
Increase nightly Lorazepam dose to 2 tabs to assist w/ sleep Ok for 2L O2 via [place]

## 2015-01-27 IMAGING — CR DG CHEST 2V
2 series · 2 of 2 positions shown · non-contrast
Comparison: 09/13/2014

CLINICAL DATA: Cough and increase weakness over the last 4 days

EXAM:
CHEST  2 VIEW

[w chest lat]
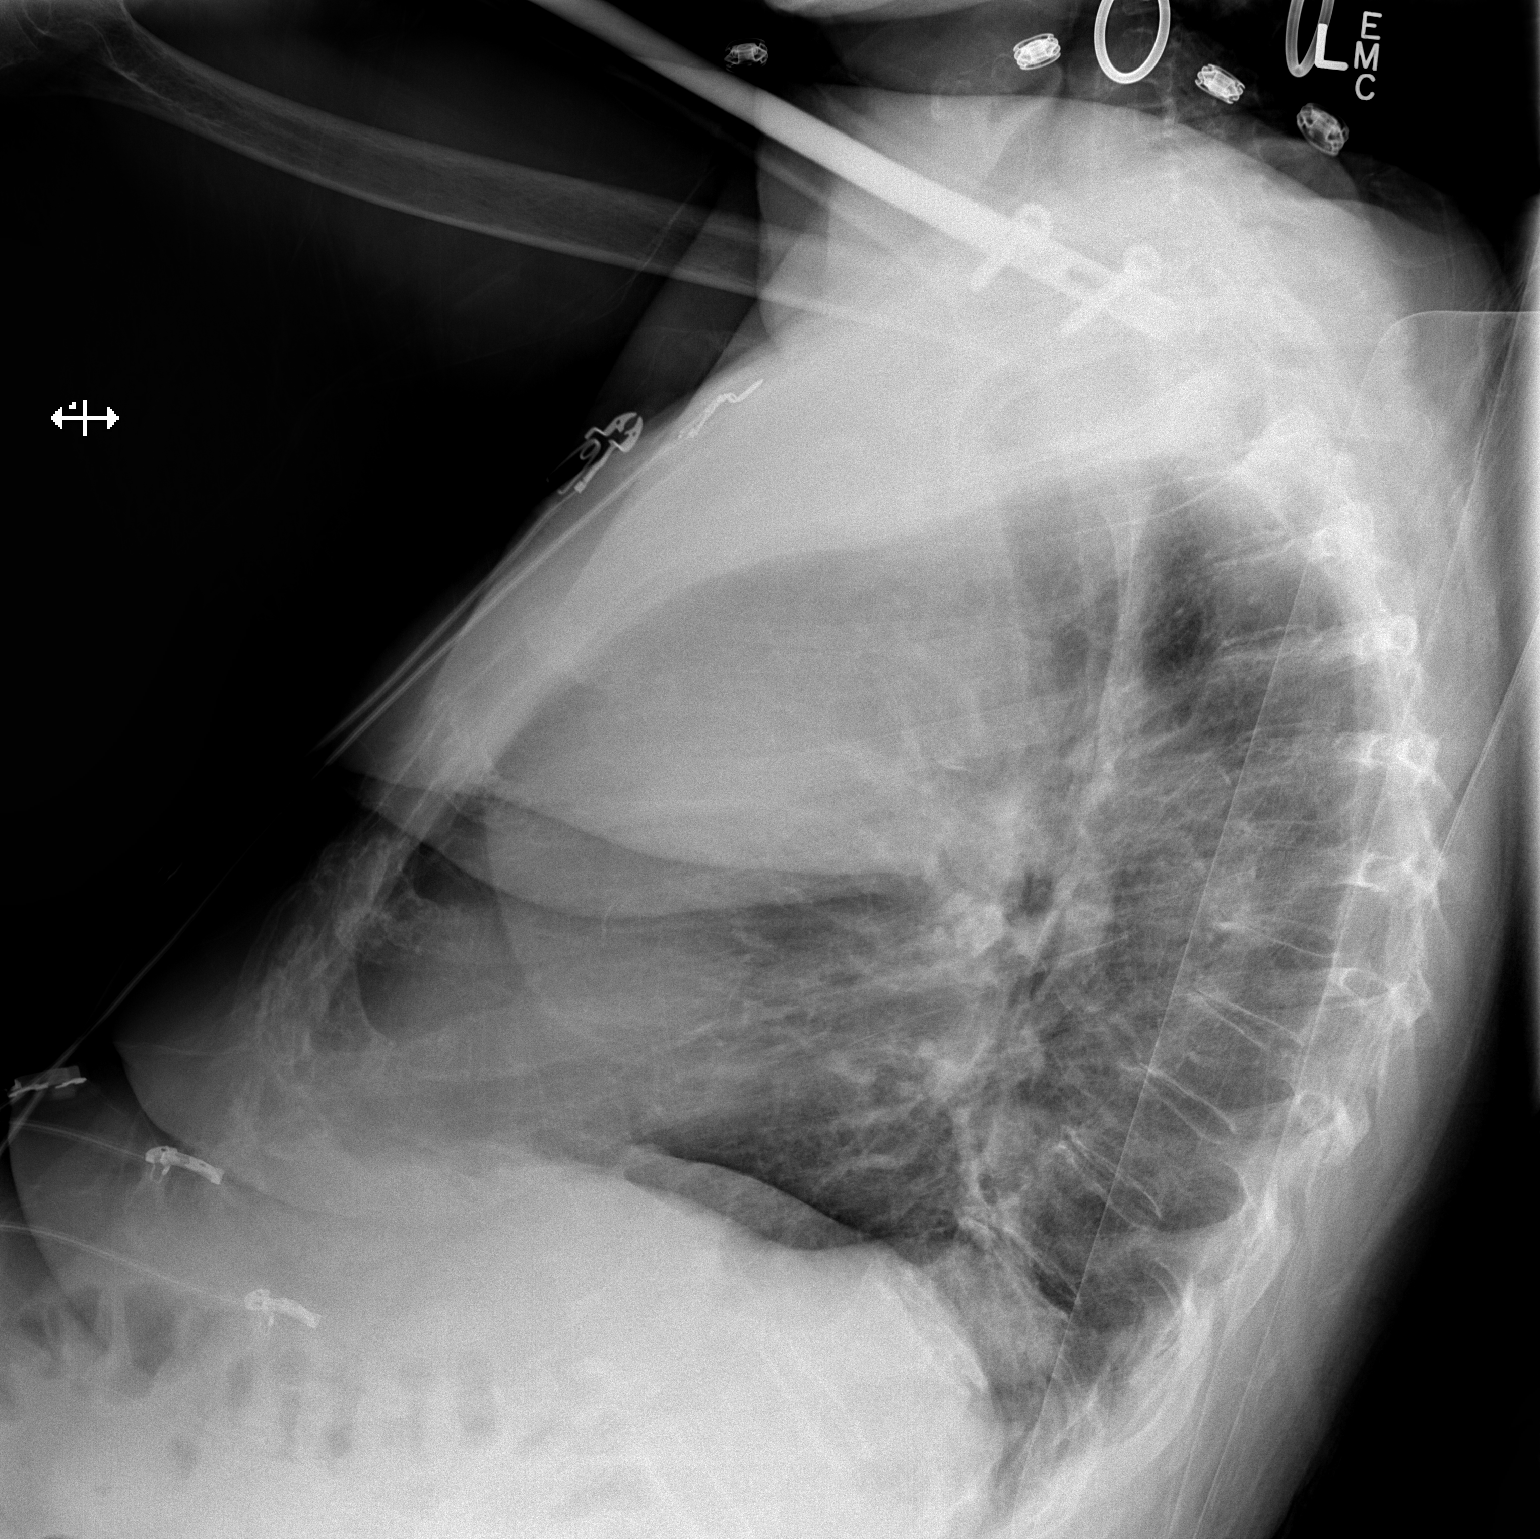

[x chest ap]
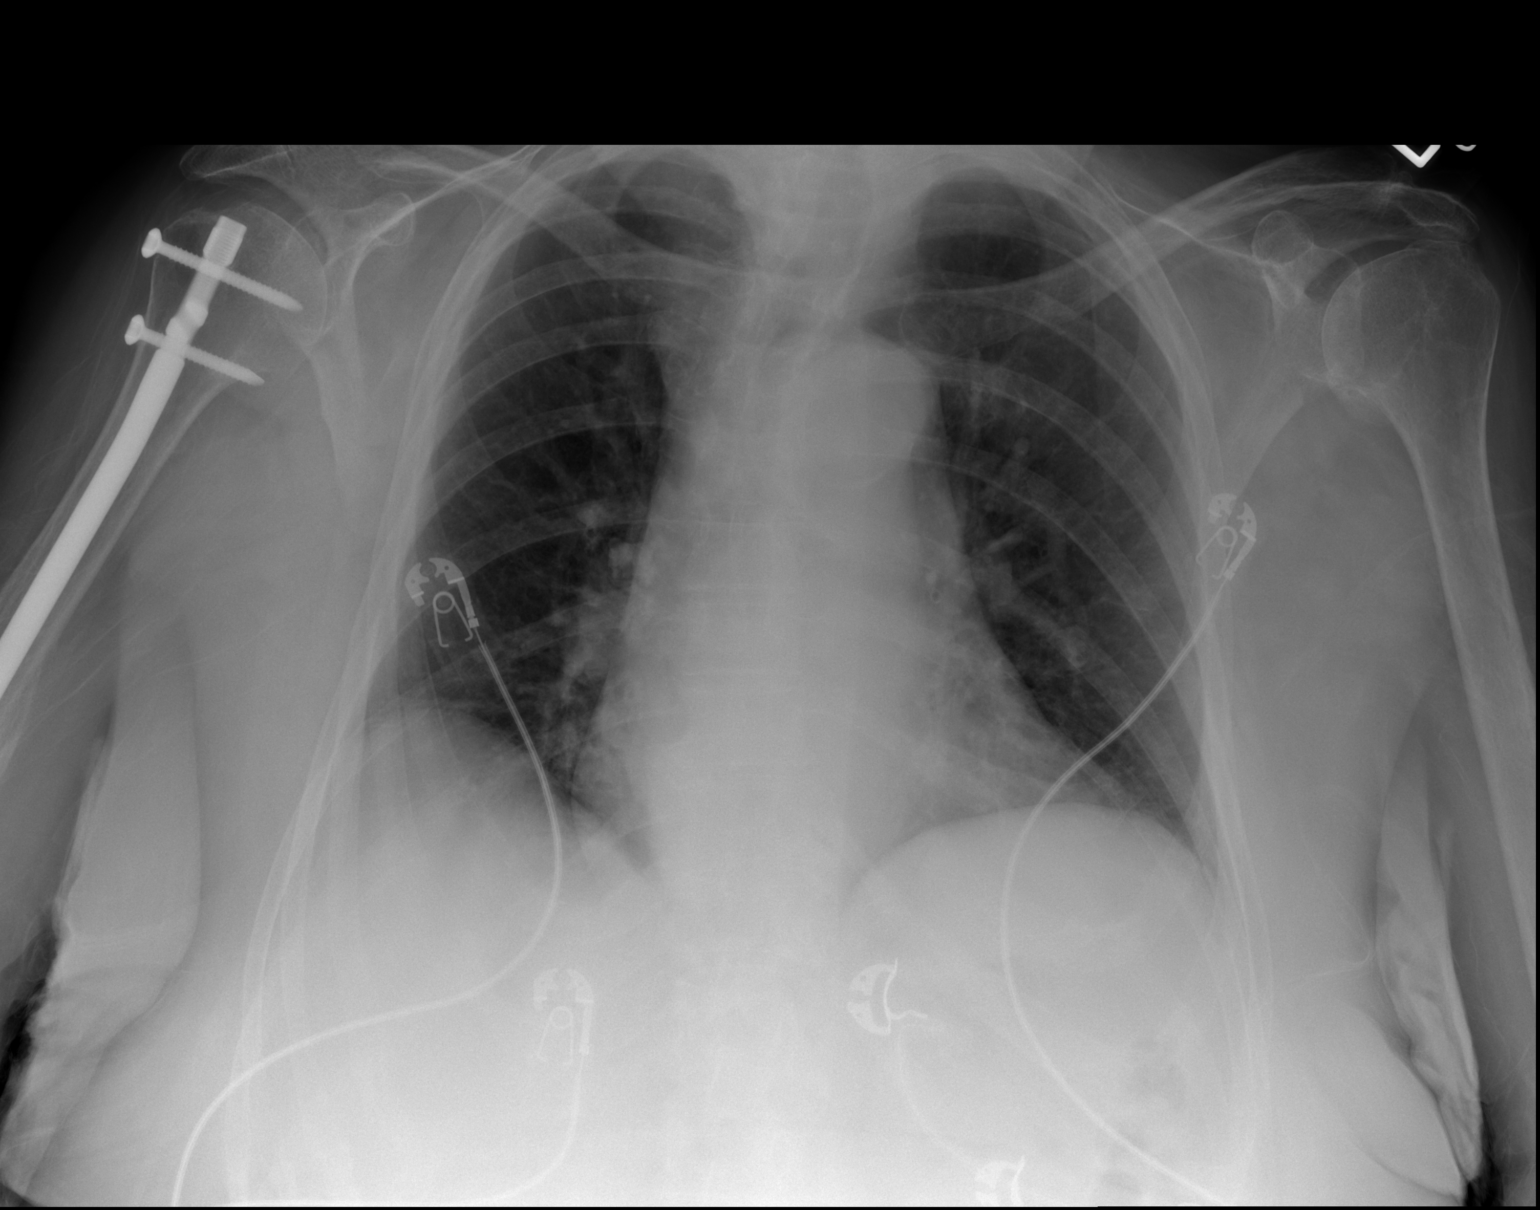

[2 of 2 positions shown; findings below may reference images not displayed]

FINDINGS: Cardiac shadow remains mildly enlarged. The lungs are well aerated
with very mild vascular congestion. No focal infiltrate is seen.
Minimal right basilar atelectasis is noted.
IMPRESSION: Mild vascular congestion and right basilar atelectasis.

## 2015-02-02 ENCOUNTER — Telehealth: Payer: Self-pay | Admitting: Family Medicine

## 2015-02-02 MED ORDER — HYDROCODONE-ACETAMINOPHEN 5-325 MG PO TABS: ORAL_TABLET | ORAL | Status: AC

## 2015-02-02 NOTE — Telephone Encounter (Signed)
Caller name: judy walker [facility] Relation to pt: Call back number: [phone] Pharmacy:  Reason for call:   Needs norco refill for patient

## 2015-02-02 NOTE — Telephone Encounter (Signed)
Ok for #60 

## 2015-02-02 NOTE — Telephone Encounter (Signed)
Med filled and faxed.  

## 2015-02-02 NOTE — Telephone Encounter (Signed)
Norco last filled 10-02-14 #30 with 0

## 2015-02-11 ENCOUNTER — Other Ambulatory Visit: Payer: Self-pay | Admitting: Neurology

## 2015-03-20 ENCOUNTER — Ambulatory Visit: Payer: Medicare HMO | Admitting: Family Medicine

## 2015-04-02 ENCOUNTER — Ambulatory Visit: Payer: Self-pay | Admitting: Adult Health

## 2015-09-29 DEATH — deceased
